# Patient Record
Sex: Male | Born: 1950 | Race: White | Hispanic: No | Smoking: Never smoker
Health system: Southern US, Community
[De-identification: ages and names within clinical notes are randomized; demographics above are authoritative.]

## PROBLEM LIST (undated history)

## (undated) DIAGNOSIS — M1990 Unspecified osteoarthritis, unspecified site: Secondary | ICD-10-CM

## (undated) DIAGNOSIS — C801 Malignant (primary) neoplasm, unspecified: Secondary | ICD-10-CM

## (undated) DIAGNOSIS — E119 Type 2 diabetes mellitus without complications: Secondary | ICD-10-CM

## (undated) DIAGNOSIS — M2550 Pain in unspecified joint: Secondary | ICD-10-CM

## (undated) DIAGNOSIS — I499 Cardiac arrhythmia, unspecified: Secondary | ICD-10-CM

## (undated) DIAGNOSIS — I10 Essential (primary) hypertension: Secondary | ICD-10-CM

## (undated) HISTORY — DX: Type 2 diabetes mellitus without complications: E11.9

---
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
## 1999-01-20 ENCOUNTER — Encounter: Payer: Self-pay | Admitting: Emergency Medicine

## 1999-01-20 ENCOUNTER — Emergency Department (HOSPITAL_COMMUNITY): Admission: EM | Admit: 1999-01-20 | Discharge: 1999-01-20 | Payer: Self-pay | Admitting: Emergency Medicine

## 2004-04-20 ENCOUNTER — Emergency Department (HOSPITAL_COMMUNITY): Admission: EM | Admit: 2004-04-20 | Discharge: 2004-04-20 | Payer: Self-pay | Admitting: *Deleted

## 2004-04-21 ENCOUNTER — Emergency Department (HOSPITAL_COMMUNITY): Admission: EM | Admit: 2004-04-21 | Discharge: 2004-04-22 | Payer: Self-pay | Admitting: Emergency Medicine

## 2005-06-19 ENCOUNTER — Ambulatory Visit (HOSPITAL_BASED_OUTPATIENT_CLINIC_OR_DEPARTMENT_OTHER): Admission: RE | Admit: 2005-06-19 | Discharge: 2005-06-19 | Payer: Self-pay | Admitting: Internal Medicine

## 2005-06-27 ENCOUNTER — Ambulatory Visit: Payer: Self-pay | Admitting: Internal Medicine

## 2005-12-03 ENCOUNTER — Ambulatory Visit: Payer: Self-pay | Admitting: Internal Medicine

## 2007-05-17 ENCOUNTER — Ambulatory Visit: Payer: Self-pay | Admitting: Internal Medicine

## 2007-06-27 ENCOUNTER — Ambulatory Visit: Payer: Self-pay | Admitting: Internal Medicine

## 2007-07-04 ENCOUNTER — Ambulatory Visit: Payer: Self-pay | Admitting: Internal Medicine

## 2007-07-04 ENCOUNTER — Encounter: Payer: Self-pay | Admitting: Internal Medicine

## 2007-07-27 HISTORY — PX: GASTRIC BYPASS: SHX52

## 2007-09-27 DIAGNOSIS — I10 Essential (primary) hypertension: Secondary | ICD-10-CM

## 2007-09-27 DIAGNOSIS — E119 Type 2 diabetes mellitus without complications: Secondary | ICD-10-CM

## 2007-09-27 DIAGNOSIS — K5730 Diverticulosis of large intestine without perforation or abscess without bleeding: Secondary | ICD-10-CM | POA: Insufficient documentation

## 2007-09-27 DIAGNOSIS — D126 Benign neoplasm of colon, unspecified: Secondary | ICD-10-CM

## 2007-09-27 DIAGNOSIS — E669 Obesity, unspecified: Secondary | ICD-10-CM | POA: Insufficient documentation

## 2007-09-27 DIAGNOSIS — G4730 Sleep apnea, unspecified: Secondary | ICD-10-CM | POA: Insufficient documentation

## 2007-09-27 HISTORY — DX: Type 2 diabetes mellitus without complications: E11.9

## 2008-07-26 HISTORY — PX: SALIVARY GLAND SURGERY: SHX768

## 2009-07-04 ENCOUNTER — Ambulatory Visit (HOSPITAL_COMMUNITY): Admission: RE | Admit: 2009-07-04 | Discharge: 2009-07-05 | Payer: Self-pay | Admitting: Otolaryngology

## 2009-07-04 ENCOUNTER — Encounter (INDEPENDENT_AMBULATORY_CARE_PROVIDER_SITE_OTHER): Payer: Self-pay | Admitting: Otolaryngology

## 2010-10-27 LAB — BASIC METABOLIC PANEL
BUN: 7 mg/dL (ref 6–23)
CO2: 29 mEq/L (ref 19–32)
Calcium: 9.1 mg/dL (ref 8.4–10.5)
Chloride: 105 mEq/L (ref 96–112)
Creatinine, Ser: 0.69 mg/dL (ref 0.4–1.5)
GFR calc Af Amer: >60 mL/min (ref >60)
GFR calc non Af Amer: >60 mL/min (ref >60)
Glucose, Bld: 131 mg/dL — ABNORMAL HIGH (ref 70–99)
Potassium: 4.4 mEq/L (ref 3.5–5.1)
Sodium: 139 mEq/L (ref 135–145)

## 2010-10-27 LAB — CBC
HCT: 43.2 % (ref 39.0–52.0)
Hemoglobin: 14.8 g/dL (ref 13.0–17.0)
MCHC: 34.3 g/dL (ref 30.0–36.0)
MCV: 92.9 fL (ref 78.0–100.0)
Platelets: 206 10*3/uL (ref 150–400)
RBC: 4.65 MIL/uL (ref 4.22–5.81)
RDW: 12.4 % (ref 11.5–15.5)
WBC: 6.9 10*3/uL (ref 4.0–10.5)

## 2010-10-27 LAB — GLUCOSE, CAPILLARY: Glucose-Capillary: 151 mg/dL — ABNORMAL HIGH (ref 70–99)

## 2010-12-08 NOTE — Assessment & Plan Note (Signed)
[facility]                         GASTROENTEROLOGY OFFICE NOTE   George Lee, George Lee                     MRN:          [id number]  DATE:05/17/2007                            DOB:          1950/08/05    REFERRING PHYSICIAN:  Geoffry Paradise, M.D.   REASON FOR CONSULTATION:  Screening colonoscopy in an insulin-requiring  diabetic with sleep apnea.   HISTORY:  This is a 60 year old white male with insulin-requiring  diabetes mellitus, morbid obesity, and sleep apnea, for which he wears a  CPAP device.  He also has hypertension.  He is referred through the  courtesy of Dr. Jacky Lee regarding his screening colonoscopy.  The  patient underwent the same evaluation on Dec 03, 2005 in this office.  See that dictation for details.  At that time, the patient states that  this office was to call him back regarding scheduling his colonoscopy,  but this was not completed.  He made no attempts to contact the office  on his own.  In any event, he was encouraged by Dr. Jacky Lee to return to  have the exam completed.  Patient tells me that he has been clinically  stable, although he is interested in proceeding with gastric bypass  surgery to address his obesity and metabolic syndrome.   PAST MEDICAL HISTORY:  1. Hypertension.  2. Insulin-requiring diabetes.  3. Sleep apnea.  4. Obesity.   PAST SURGICAL HISTORY:  Left knee surgery.   ALLERGIES:  Intolerant to CODEINE.   CURRENT MEDICATIONS:  1. Multivitamin.  2. Aspirin 325 mg 2 daily.  3. Triamterene/hydrochlorothiazide 37.5/25 mg daily.  4. Actos 45 mg daily.  5. Metformin 1 gm p.o. b.i.d.  6. Lantus insulin 70 units in the morning and at night.   FAMILY HISTORY:  Negative for gastrointestinal malignancy.   SOCIAL HISTORY:  Patient is divorced with two daughters.  He does not  smoke.  Rarely uses alcohol.   REVIEW OF SYSTEMS:  Negative.   PHYSICAL EXAMINATION:  An obese gentleman in no acute  distress.  Blood pressure is 154/92, heart rate 64, weight is 274.2 pounds.  HEENT:  Sclerae are anicteric.  Conjunctivae are pink.  Oral mucosa  intact.  LUNGS:  Clear.  HEART:  Regular.  ABDOMEN:  Obese and soft without tenderness or mass.  Good bowel sounds  heard.   IMPRESSION:  This is a 60 year old diabetic with sleep apnea who  presents regarding screening colonoscopy.  The nature of the procedure  as well as the risks, benefits and alternatives have been reviewed.  He  understood and agreed to proceed.  With regards to management of his  insulin and oral diabetic agents, we have recommended that we hold both  the morning of his exam.  Also, he has been asked to bring his CPAP  device to the endoscopy suite.  He understands and agrees to proceed.     George Lee. Marina Goodell, MD  Electronically Signed    JNP/MedQ  DD: 05/17/2007  DT: 05/18/2007  Job #: [id number]   cc:   Geoffry Paradise, M.D.

## 2010-12-11 NOTE — Procedures (Signed)
George Lee, George Lee NO.:  [id number]   MEDICAL RECORD NO.:  [id number]          PATIENT TYPE:  OUT   LOCATION:  SLEEP CENTER                 FACILITY:  [facility]   PHYSICIAN:  Clinton D. Maple Hudson, M.D. DATE OF BIRTH:  1951-01-05   DATE OF STUDY:  06/19/2005                              NOCTURNAL POLYSOMNOGRAM   REFERRING PHYSICIAN:  Dr. Geoffry Paradise.   DATE OF STUDY:  June 19, 2005.   INDICATION FOR STUDY:  Hypersomnia with sleep apnea. Epworth sleepiness  score 13/24, BMI 35.7, weight 250 pounds. The patient has a history of  obstructive sleep apnea and has been using CPAP at home. A split study  protocol was requested.   SLEEP ARCHITECTURE:  Total sleep time 315 minutes with sleep efficiency 78%.  Stage I was 31%, stage II 52%, stages III and IV were absent, REM 17% of  total sleep time. Sleep latency 15 minutes, REM latency 259 minutes, awake  after sleep onset 76 minutes, arousal index increased at 45. Sleep was  fragmented initially as the protocol began without CPAP and the patient is  used to wearing CPAP at home. No bedtime medication was taken.   RESPIRATORY DATA:  Split study protocol. Apnea hypopnea index (AHI, RDI)  91.2 obstructive events per hour indicating very severe obstructive sleep  apnea/hypopnea syndrome before CPAP. This included 213 obstructive apneas  and 2 hypopneas before CPAP. Events were not positional with most sleep time  either supine or on right side. REM AHI 2.3 per hour. CPAP was titrated to  14 CWP, 1.2 per hour. A small Respironics ComfortGel nasal mask was used  with a heated humidifier.   OXYGEN DATA:  Moderate snoring with oxygen desaturation to a nadir of 74%  before CPAP. After CPAP control saturation held 95-98% on room air.   CARDIAC DATA:  Normal sinus rhythm.   MOVEMENT/PARASOMNIA:  Occasional leg jerks mostly related to respiratory  events. Bathroom x1.   IMPRESSION/RECOMMENDATION:  1.  Severe obstructive  sleep apnea/hypopnea syndrome, apnea hypopnea index      91.2 per hour with nonpositional events, moderate snoring and oxygen      desaturation to 74%.  2.  Successful continuous positive airway pressure titration to 14 CWP, AHI      1.2 per hour. A small Respironics ComfortGel nasal mask was used with a      heated humidifier.      Clinton D. Maple Hudson, M.D.  Diplomate, Biomedical engineer of Sleep Medicine  Electronically Signed     CDY/MEDQ  D:  06/27/2005 09:26:51  T:  06/27/2005 10:36:37  Job:  [id number]

## 2012-08-21 ENCOUNTER — Ambulatory Visit: Payer: BC Managed Care – PPO | Admitting: Family Medicine

## 2012-08-21 VITALS — BP 146/79 | HR 69 | Temp 98.1°F | Resp 18 | Ht 69.0 in | Wt 196.2 lb

## 2012-08-21 DIAGNOSIS — M720 Palmar fascial fibromatosis [Dupuytren]: Secondary | ICD-10-CM

## 2012-08-21 DIAGNOSIS — Z23 Encounter for immunization: Secondary | ICD-10-CM

## 2012-08-21 DIAGNOSIS — L0591 Pilonidal cyst without abscess: Secondary | ICD-10-CM

## 2012-08-21 NOTE — Progress Notes (Signed)
Patient ID: George Lee. MRN: [id number], DOB: 1950-10-23 62 y.o. Date of Encounter: 08/21/2012, 6:22 PM  Primary Physician: No primary provider on file.  Chief Complaint: Physical (CPE)  HPI: 62 y.o. y/o male with history noted below here for CPE.  Doing well. No issues/complaints except the pilonidal cyst and right dupuytren's contracture right hand  Review of Systems: Consitutional: No fever, chills, fatigue, night sweats, lymphadenopathy, or weight changes. Eyes: No visual changes, eye redness, or discharge. ENT/Mouth: Ears: No otalgia, tinnitus, hearing loss, discharge. Nose: No congestion, rhinorrhea, sinus pain, or epistaxis. Throat: No sore throat, post nasal drip, or teeth pain. Cardiovascular: No CP, palpitations, diaphoresis, DOE, edema, orthopnea, PND. Respiratory: No cough, hemoptysis, SOB, or wheezing. Gastrointestinal: No anorexia, dysphagia, reflux, pain, nausea, vomiting, hematemesis, diarrhea, constipation, BRBPR, or melena. Genitourinary: No dysuria, frequency, urgency, hematuria, incontinence, nocturia, decreased urinary stream, discharge, impotence, or testicular pain/masses. Musculoskeletal: No decreased ROM, myalgias, stiffness, joint swelling, or weakness. Skin: No rash, erythema, lesion changes, pain, warmth, jaundice, or pruritis. Neurological: No headache, dizziness, syncope, seizures, tremors, memory loss, coordination problems, or paresthesias. Psychological: No anxiety, depression, hallucinations, SI/HI. Endocrine: No fatigue, polydipsia, polyphagia, polyuria, or known diabetes. All other systems were reviewed and are otherwise negative.  History reviewed. No pertinent past medical history.   Past Surgical History  Procedure Date  . Gastric bypass 2009    Home Meds:  Prior to Admission medications   Medication Sig Start Date End Date Taking? Authorizing Provider  losartan (COZAAR) 50 MG tablet Take 50 mg by mouth daily.   Yes Historical  Provider, MD    Allergies: No Known Allergies  History   Social History  . Marital Status: Divorced    Spouse Name: N/A    Number of Children: N/A  . Years of Education: N/A   Occupational History  . Not on file.   Social History Main Topics  . Smoking status: Never Smoker   . Smokeless tobacco: Not on file  . Alcohol Use: Not on file  . Drug Use: Not on file  . Sexually Active: Not on file   Other Topics Concern  . Not on file   Social History Narrative  . No narrative on file    Family History  Problem Relation Age of Onset  . Heart disease Mother   . Cancer Father    Father died at 80 Physical Exam: Blood pressure 146/79, pulse 69, temperature 98.1 F (36.7 C), temperature source Oral, resp. rate 18, height 5\' 9"  (1.753 m), weight 196 lb 3.2 oz (88.996 kg), SpO2 97.00%.  General: Well developed, well nourished, in no acute distress. HEENT: Normocephalic, atraumatic. Conjunctiva pink, sclera non-icteric. Pupils 2 mm constricting to 1 mm, round, regular, and equally reactive to light and accomodation. EOMI. Internal auditory canal clear. TMs with good cone of light and without pathology. Nasal mucosa pink. Nares are without discharge. No sinus tenderness. Oral mucosa pink. Dentition good. Pharynx without exudate.   Neck: Supple. Trachea midline. No thyromegaly. Full ROM. No lymphadenopathy. Lungs: Clear to auscultation bilaterally without wheezes, rales, or rhonchi. Breathing is of normal effort and unlabored. Cardiovascular: RRR with S1 S2. No murmurs, rubs, or gallops appreciated. Distal pulses 2+ symmetrically. No carotid or abdominal bruits Abdomen: Soft, non-tender, non-distended with normoactive bowel sounds. No hepatosplenomegaly or masses. No rebound/guarding. No CVA tenderness. Without hernias.  Rectal: No external hemorrhoids or fissures. Rectal vault without masses.  Genitourinary:  circumcised male. No penile lesions. Testes descended bilaterally, and smooth  without tenderness or masses.  Musculoskeletal: Full range of motion and 5/5 strength throughout. Without swelling, atrophy, tenderness, crepitus, or warmth. Extremities without clubbing, cyanosis, or edema. Calves supple. Skin: Warm and moist without erythema, ecchymosis, wounds, or rash. Neuro: A+Ox3. CN II-XII grossly intact. Moves all extremities spontaneously. Full sensation throughout. Normal gait. DTR 2+ throughout upper and lower extremities. Finger to nose intact. Psych:  Responds to questions appropriately with a normal affect.     Assessment/Plan:  62 y.o. y/o  male here for CPE 1. Pilonidal cyst  Ambulatory referral to General Surgery  2. Dupuytren contracture      -  Signed, Elvina Sidle, MD 08/21/2012 6:22 PM     \

## 2012-08-21 NOTE — Patient Instructions (Signed)
Dupuytren's Contracture Dupuytren's contracture affects the fingers and the palm of the hand. This condition usually develops slowly. It may take many years to develop. The pinky finger and the ring finger are most often affected. These fingers start to curve inward, like a claw. At some point, the fingers cannot go straight anymore. This can make it hard to do things like:  Put on gloves.  Shake hands.  Grab something off a shelf. The condition usually does not cause pain and is not dangerous. The condition gets its name from the doctor who came up with an operation to fix the problem. His name was Baron Guillaume Dupuytren. Contracture means pulling inward. CAUSES  Dupuytren's contracture does not start with the fingers. It starts in the palm of the hand, under the skin. The tissue under the skin is called fascia. The fascia covers the cords (tendons) that control how the fingers move. In Dupuytren's contracture the fascia tissue becomes thick and then pulls on the cords. That causes the fingers to curl. The condition can affect both hands and any fingers, but it usually strikes one hand worse than the other. The fingers farthest from the thumb are most often the ones that curl. The cause is not clear. Some experts believe it results from an autoimmune reaction. That means the body's immune system (which fights off disease) attacks itself by mistake. What experts do know is that certain conditions and behaviors (called risk factors) make the chance of having this condition more likely. They include:  Age. Most people who have the condition are older than 50.  Sex. It affects men more often than women.  Family history. The condition tends to run in families from countries in [place].  Certain behaviors. People who smoke and drink alcohol are more apt to develop the problem.  Some other medical conditions. Having diabetes makes Dupuytren's contracture more likely. So does  having a condition that involves a seizure (when the brain's function is interrupted). SYMPTOMS  Signs of this condition take time to develop. Sometimes this takes weeks or months. More often, it takes several years.   Early symptoms:  Skin on the palm of the hand becomes thick. This is usually the first sign.  The skin may look dimpled or puckered.  Lumps (nodules) show up on the palm. There may be one or more lumps. They are not painful.  Later symptoms:  Thick cords of tissue form in the palm of the hand.  The pinky and ring fingers start to curl up into the palm.  The fingers cannot be straightened into their normal position. DIAGNOSIS  A physical examination is the main way that a healthcare provider can tell if you have Dupuytren's contracture. Other tests usually are not needed. The caregiver will probably:  Look at your hands. Feel your hands. This is to check for thickening and nodules.  Measure finger motion. This tells how much your fingers have contracted (pulled in).  Do a tabletop test. You will be asked to try to put your hand flat on a table, palm down. TREATMENT  There is no cure for Dupuytren's contracture. But there are ways to treat the symptoms. Options include:  Watching and waiting. The condition develops slowly. Often it does not create problems for a long time. Sometimes the skin gets thick and nodules form, but the fingers never curl. So, in some cases it is best to just watch the condition carefully and wait to see what happens.    Shots (injections). Different substances may be injected, including:  Steroids. These drugs block swelling. These shots should make the condition less uncomfortable. Steroids may also slow down the condition. Shots are given into the nodules. The effect only lasts awhile. More shots may have to be given.  Enzymes. These are proteins. They weaken the thick tissue. After an injection, the caregiver usually stretches the  fingers.  Needling. A needle is pushed through the skin and into the thick tissue. This is done in several spots. The goal is to break up the thickened tissue. Or to weaken it.  Surgery. This may be suggested if you cannot grasp objects. Or, if you can no longer put your hand in your pocket.  A cut (incision) is made in the palm of the hand. The thick tissue is removed.  Sometimes the thick tissue is attached to the skin. Then, the skin must be removed, too. It is replaced with a piece of skin from another place on your body. That is called a skin graft.  Occupational or hand therapy is almost always needed after surgery. This involves special exercises to get back the use of your hand and fingers. After a skin graft, several months of therapy may be needed.  Sometimes the condition comes back, even after surgery.  Other methods. You can do some things on your own. They include:  Stretching the fingers backwards. Do this often.  Warming the hand and massaging it. Again, do this often.  Using tools with padded grips. This should make things easier.  Wearing heavy gloves while working. This protects the hands. PROGNOSIS  Dupuytren's contracture usually develops slowly. There is no cure. But, the symptoms can be treated. Sometimes they come back after treatment, but not always. It is important to remember that this is a functional problem and not a life-threatening condition. Document Released: 05/09/2009 Document Revised: 10/04/2011 Document Reviewed: 05/09/2009 [facility] Patient Information 2013 [place], [place]. Pilonidal Cyst A pilonidal cyst occurs when hairs get trapped (ingrown) beneath the skin in the crease between the buttocks over your sacrum (the bone under that crease). Pilonidal cysts are most common in young men with a lot of body hair. When the cyst is ruptured (breaks) or leaking, fluid from the cyst may cause burning and itching. If the cyst becomes infected, it causes a  painful swelling filled with pus (abscess). The pus and trapped hairs need to be removed (often by lancing) so that the infection can heal. However, recurrence is common and an operation may be needed to remove the cyst. HOME CARE INSTRUCTIONS   If the cyst was NOT INFECTED:  Keep the area clean and dry. Bathe or shower daily. Wash the area well with a germ-killing soap. Warm tub baths may help prevent infection and help with drainage. Dry the area well with a towel.  Avoid tight clothing to keep area as moisture free as possible.  Keep area between buttocks as free of hair as possible. A depilatory may be used.  If the cyst WAS INFECTED and needed to be drained:  Your caregiver packed the wound with gauze to keep the wound open. This allows the wound to heal from the inside outwards and continue draining.  Return for a wound check in 1 day or as suggested.  If you take tub baths or showers, repack the wound with gauze following them. Sponge baths (at the sink) are a good alternative.  If an antibiotic was ordered to fight the infection, take as directed.  Only take over-the-counter or prescription medicines for pain, discomfort, or fever as directed by your caregiver.  After the drain is removed, use sitz baths for 20 minutes 4 times per day. Clean the wound gently with mild unscented soap, pat dry, and then apply a dry dressing. SEEK MEDICAL CARE IF:   You have increased pain, swelling, redness, drainage, or bleeding from the area.  You have a fever.  You have muscles aches, dizziness, or a general ill feeling. Document Released: 07/09/2000 Document Revised: 10/04/2011 Document Reviewed: 09/06/2008 [facility] Patient Information 2013 [place], [place].

## 2012-08-24 ENCOUNTER — Encounter: Payer: Self-pay | Admitting: Family Medicine

## 2012-09-04 ENCOUNTER — Ambulatory Visit (INDEPENDENT_AMBULATORY_CARE_PROVIDER_SITE_OTHER): Payer: BC Managed Care – PPO | Admitting: Surgery

## 2012-09-11 ENCOUNTER — Encounter (INDEPENDENT_AMBULATORY_CARE_PROVIDER_SITE_OTHER): Payer: Self-pay

## 2012-09-11 ENCOUNTER — Ambulatory Visit (INDEPENDENT_AMBULATORY_CARE_PROVIDER_SITE_OTHER): Payer: BC Managed Care – PPO | Admitting: Surgery

## 2012-09-11 ENCOUNTER — Encounter (INDEPENDENT_AMBULATORY_CARE_PROVIDER_SITE_OTHER): Payer: Self-pay | Admitting: Surgery

## 2012-09-11 VITALS — BP 134/82 | HR 80 | Temp 97.4°F | Resp 18 | Ht 69.0 in | Wt 195.0 lb

## 2012-09-11 DIAGNOSIS — M533 Sacrococcygeal disorders, not elsewhere classified: Secondary | ICD-10-CM | POA: Insufficient documentation

## 2012-09-11 NOTE — Progress Notes (Signed)
Subjective:     Patient ID: George Lee., male   DOB: 05-31-1951, 62 y.o.   MRN: [id number]  HPI  George Lee  1950-11-10 [id number]  Patient Care Team: Minda Meo, MD as PCP - General (Internal Medicine) Elvina Sidle, MD as Referring Physician (Family Medicine)  This patient is a 62 y.o.male who presents today for surgical evaluation at the request of Dr. Danella Deis.   Reason for visit: Concern of pilonidal disease.  Pleasant male.  Underwent laparoscopic gastric bypass in [facility] and 2009.  Lost weight.  Diabetes cured.  More active now.  He notes since he has lost all that weight his backside is much more sensitive.  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  Recalls evidence of infections down there when he was a teenager.  Had drainage then.  Never had to have any incision and drainage done.  Does use heater compresses.  No problems for years.  The main issues and pain had an episode of skin infection on her right calf a few years ago that required incision and drainage.  That did not seem to be associated with his tailbone pain.  Normally has bowel movement twice a day.  Tolerates a rather regular diet after his gastric bypass.  Rather active.  Based on concerns, he discussed it with his primary care physician team.  Dr. Danella Deis sent the patient to me for evaluation.  Patient Active Problem List  Diagnosis  . COLONIC POLYPS  . DIABETES MELLITUS  . OBESITY  . HYPERTENSION  . DIVERTICULOSIS, SIGMOID COLON  . SLEEP APNEA    No past medical history on file.  Past Surgical History  Procedure Laterality Date  . Gastric bypass  2009  . Salivary gland surgery  2010    stone    History   Social History  . Marital Status: Divorced    Spouse Name: N/A    Number of Children: N/A  . Years of Education: N/A   Occupational History  . Not on file.   Social History Main Topics  . Smoking status: Never Smoker   .  Smokeless tobacco: Not on file  . Alcohol Use: Not on file  . Drug Use: Not on file  . Sexually Active: Not on file   Other Topics Concern  . Not on file   Social History Narrative  . No narrative on file    Family History  Problem Relation Age of Onset  . Heart disease Mother   . Cancer Father     Current Outpatient Prescriptions  Medication Sig Dispense Refill  . Calcium Carbonate-Vitamin D (CALCIUM-VITAMIN D) 500-200 MG-UNIT per tablet Take 1 tablet by mouth 2 (two) times daily with a meal.      . calcium-vitamin D (OSCAL WITH D) 500-200 MG-UNIT per tablet Take 1 tablet by mouth 2 (two) times daily.      . ferrous fumarate (HEMOCYTE - 106 MG FE) 325 (106 FE) MG TABS Take 1 tablet by mouth.      . losartan (COZAAR) 50 MG tablet Take 50 mg by mouth daily.      . magnesium 30 MG tablet Take 30 mg by mouth 2 (two) times daily.      . Multiple Vitamin (MULTIVITAMIN) tablet Take 1 tablet by mouth daily.       No current facility-administered medications for this visit.     No Known Allergies  BP 134/82  Pulse  80  Temp(Src) 97.4 F (36.3 C)  Resp 18  Ht 5\' 9"  (1.753 m)  Wt 195 lb (88.451 kg)  BMI 28.78 kg/m2  No results found.    Review of Systems  Constitutional: Negative for fever, chills and diaphoresis.  HENT: Negative for nosebleeds, sore throat, facial swelling, mouth sores, trouble swallowing and ear discharge.   Eyes: Negative for photophobia, discharge and visual disturbance.  Respiratory: Negative for choking, chest tightness, shortness of breath and stridor.   Cardiovascular: Negative for chest pain and palpitations.  Gastrointestinal: Negative for nausea, vomiting, abdominal pain, diarrhea, constipation, blood in stool, abdominal distention, anal bleeding and rectal pain.  Genitourinary: Negative for dysuria, urgency, difficulty urinating and testicular pain.  Musculoskeletal: Negative for myalgias, back pain, arthralgias and gait problem.  Skin: Negative  for color change, pallor, rash and wound.  Neurological: Negative for dizziness, speech difficulty, weakness, numbness and headaches.  Hematological: Negative for adenopathy. Does not bruise/bleed easily.  Psychiatric/Behavioral: Negative for hallucinations, confusion and agitation.       Objective:   Physical Exam  Constitutional: He is oriented to person, place, and time. He appears well-developed and well-nourished. No distress.  HENT:  Head: Normocephalic.  Mouth/Throat: Oropharynx is clear and moist. No oropharyngeal exudate.  Eyes: Conjunctivae and EOM are normal. Pupils are equal, round, and reactive to light. No scleral icterus.  Neck: Normal range of motion. Neck supple. No tracheal deviation present.  Cardiovascular: Normal rate, regular rhythm and intact distal pulses.   Pulmonary/Chest: Effort normal and breath sounds normal. No respiratory distress.  Abdominal: Soft. He exhibits no distension. There is no tenderness. Hernia confirmed negative in the right inguinal area and confirmed negative in the left inguinal area.  Genitourinary:  Perianal skin clean with good hygiene.  No pruritis.  No external skin tags / hemorrhoids of significance.  No pilonidal disease.  No fissure.  No abscess/fistula.  Normal sphincter tone.      Musculoskeletal: Normal range of motion. He exhibits no tenderness.       Back:  Lymphadenopathy:    He has no cervical adenopathy.       Right: No inguinal adenopathy present.       Left: No inguinal adenopathy present.  Neurological: He is alert and oriented to person, place, and time. No cranial nerve deficit. He exhibits normal muscle tone. Coordination normal.  Skin: Skin is warm and dry. No rash noted. He is not diaphoretic. No erythema. No pallor.  Psychiatric: He has a normal mood and affect. His behavior is normal. Judgment and thought content normal.       Assessment:     Coccydynia.  No evidence of pilonidal disease     Plan:     I  see no evidence of pilonidal disease or infection.  I recommended an anti-inflammatory regimen for the next 3-4 weeks.  Given his history of gastric bypass, and NSIADse not a good long-term option.  Therefore Tylenol 1 gram 3-4 times a day.  Also heat.  Continue relieving pressure off his tailbone using the U-shaped cushions that he uses.  He works well while driving the truck.  He should continue to use it at home as well.  Break the cycle of re-triggering the pain.  If he has not getting better, consider evaluation by a spine surgeon to see what other options are available.  I would hesitate to do steroid injections or removing of the coccyx at this time until other options have been exhausted.  I  was able to reach Dr. Benjie Karvonen with spine surgery.  He thought the above recommendations were reasonable.  If not better by six weeks, consider plain films and followup with Dr. Simonne Come to see what other options are available.  Usually anti-inflammatories and pressure relief resolve most of these issues in the absence of other symptoms.

## 2012-09-11 NOTE — Patient Instructions (Addendum)
Managing Pain  Pain after surgery or related to activity is often due to strain/injury to muscle, tendon, nerves and/or incisions.  This pain is usually short-term and will improve in a few months.   Many people find it helpful to do the following things TOGETHER to help speed the process of healing and to get back to regular activity more quickly:  1. Avoid heavy physical activity a.  no lifting greater than 20 pounds b. Do not "push through" the pain.  Listen to your body and avoid positions and maneuvers than reproduce the pain c. Walking is okay as tolerated, but go slowly and stop when getting sore.  d. Remember: If it hurts to do it, then don't do it! 2. Take Anti-inflammatory medication  a. Take with food/snack around the clock for 3-4 weeks i. This helps the muscle and nerve tissues become less irritable and calm down faster ii. Choose Acetaminophen 500mg  tabs (Tylenol) 2 pills with every meal and just before bedtime 3. Use a Heating pad or Ice/Cold Pack a. 4-6 times a day b. May use warm bath/hottub  or showers 4. Try Gentle Massage and/or Stretching  a. at the area of pain many times a day b. stop if you feel pain - do not overdo it  Try these steps together to help you body heal faster and avoid making things get worse.  Doing just one of these things may not be enough.    If you are not getting better after two weeks or are noticing you are getting worse, contact our office for further advice; we may need to re-evaluate you & see what other things we can do to help.    Tailbone Injury The tailbone (coccyx) is the small bone at the lower end of the spine. A tailbone injury may involve stretched ligaments, bruising, or a broken bone (fracture). Women are more vulnerable to this injury due to having a wider pelvis. CAUSES  This type of injury typically occurs from falling and landing on the tailbone. Repeated strain or friction from actions such as rowing and bicycling may  also injure the area. The tailbone can be injured during childbirth. Infections or tumors may also press on the tailbone and cause pain. Sometimes, the cause of injury is unknown. SYMPTOMS   Bruising.  Pain when sitting.  Painful bowel movements.  In women, pain during intercourse. DIAGNOSIS  Your caregiver can diagnose a tailbone injury based on your symptoms and a physical exam. X-rays may be taken if a fracture is suspected. Your caregiver may also use an MRI scan imaging test to evaluate your symptoms. TREATMENT  Your caregiver may prescribe medicines to help relieve your pain. Most tailbone injuries heal on their own in 4 to 6 weeks. However, if the injury is caused by an infection or tumor, the recovery period may vary. PREVENTION  Wear appropriate padding and sports gear when bicycling and rowing. This can help prevent an injury from repeated strain or friction. HOME CARE INSTRUCTIONS   Put ice on the injured area.  Put ice in a plastic bag.  Place a towel between your skin and the bag.  Leave the ice on for 15 to 20 minutes, every hour while awake for the first 1 to 2 days.  Sit on a large, rubber or inflated ring or cushion to ease your pain. Lean forward when sitting to help decrease discomfort.  Avoid sitting for long periods of time.  Increase your activity as the pain allows.  Only take over-the-counter or prescription medicines for pain, discomfort, or fever as directed by your caregiver.  You may use stool softeners if it is painful to have a bowel movement, or as directed by your caregiver.  Eat a diet with plenty of fiber to help prevent constipation.  Keep all follow-up appointments as directed by your caregiver. SEEK MEDICAL CARE IF:   Your pain becomes worse.  Your bowel movements cause a great deal of discomfort.  You are unable to have a bowel movement.  You have a fever. MAKE SURE YOU:  Understand these instructions.  Will watch your  condition.  Will get help right away if you are not doing well or get worse. Document Released: 07/09/2000 Document Revised: 10/04/2011 Document Reviewed: 02/04/2011 [facility] Patient Information 2013 [place], [place].   George Lee still sensitive at the coccyx.  Think this is a chronic coccyx pain.  I strongly recommend he do an anti-inflammatory regimen for at least three weeks.  Given his history gastric bypass, would be safest to do Tylenol 1 g 3 times a day.

## 2014-05-03 ENCOUNTER — Ambulatory Visit (INDEPENDENT_AMBULATORY_CARE_PROVIDER_SITE_OTHER): Payer: Self-pay | Admitting: Family Medicine

## 2014-05-03 VITALS — BP 132/76 | HR 93 | Temp 98.1°F | Resp 16 | Ht 69.0 in | Wt 188.4 lb

## 2014-05-03 DIAGNOSIS — Z131 Encounter for screening for diabetes mellitus: Secondary | ICD-10-CM

## 2014-05-03 DIAGNOSIS — E119 Type 2 diabetes mellitus without complications: Secondary | ICD-10-CM

## 2014-05-03 LAB — POCT GLYCOSYLATED HEMOGLOBIN (HGB A1C): Hemoglobin A1C: 7.8

## 2014-05-03 MED ORDER — METFORMIN HCL 500 MG PO TABS: 500.0000 mg | ORAL_TABLET | Freq: Every day | ORAL | Status: DC

## 2014-05-03 NOTE — Progress Notes (Signed)
Chief Complaint:  Chief Complaint  Patient presents with  . Diabetes    Needs proof of treatment and labs...to extend DOT from 3 months to 1 year with another provider    HPI: George Lee. is a 63 y.o. male who is here for  DM followup He has HTN and has DM  In the past, he has been out of work for 1 year. 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  He needsa 1 year DOT to get this new job he is applying for. He was morbidly onese and had HTN and DM prior to gettign a gastric byplass ( [place]) and after the operation he was not longer hypertensive or diabetic with weight loss and lifestyle modifications. Prior to today's DOT PE he was a diet controlled diabetic. Since he has been unemployed he has been eating poorly and not exercising, he ahs gained some weight back. He states when he is working he eats really well.   Prior to the surgery he was 360 lbs, today he is 188 lbs.   Past Medical History  Diagnosis Date  . DIABETES MELLITUS 09/27/2007    Qualifier: Diagnosis of  By: Harlon Ditty CMA (AAMA), Dottie     Past Surgical History  Procedure Laterality Date  . Gastric bypass  2009  . Salivary gland surgery  2010    stone   History   Social History  . Marital Status: Divorced    Spouse Name: N/A    Number of Children: N/A  . Years of Education: N/A   Social History Main Topics  . Smoking status: Never Smoker   . Smokeless tobacco: None  . Alcohol Use: None  . Drug Use: None  . Sexual Activity: None   Other Topics Concern  . None   Social History Narrative  . None   Family History  Problem Relation Age of Onset  . Heart disease Mother   . Cancer Father    No Known Allergies Prior to Admission medications   Medication Sig Start Date End Date Taking? Authorizing Provider  Calcium Carbonate-Vitamin D (CALCIUM-VITAMIN D) 500-200 MG-UNIT per tablet Take 1 tablet by  mouth 2 (two) times daily with a meal.   Yes Historical Provider, MD  calcium-vitamin D (OSCAL WITH D) 500-200 MG-UNIT per tablet Take 1 tablet by mouth 2 (two) times daily.   Yes Historical Provider, MD  ferrous fumarate (HEMOCYTE - 106 MG FE) 325 (106 FE) MG TABS Take 1 tablet by mouth.   Yes Historical Provider, MD  magnesium 30 MG tablet Take 30 mg by mouth 2 (two) times daily.   Yes Historical Provider, MD  Multiple Vitamin (MULTIVITAMIN) tablet Take 1 tablet by mouth daily.   Yes Historical Provider, MD  losartan (COZAAR) 50 MG tablet Take 50 mg by mouth daily.    Historical Provider, MD     ROS: The patient denies fevers, chills, night sweats, unintentional weight loss, chest pain, palpitations, wheezing, dyspnea on exertion, nausea, vomiting, abdominal pain, dysuria, hematuria, melena, numbness, weakness, or tingling.   All other systems have been reviewed and were otherwise negative with the exception of those mentioned in the HPI and as above.    PHYSICAL EXAM: Filed Vitals:   05/03/14 1438  BP: 132/76  Pulse: 93  Temp: 98.1 F (36.7 C)  Resp: 16  Filed Vitals:   05/03/14 1438  Height: 5\' 9"  (1.753 m)  Weight: 188 lb 6.4 oz (85.458 kg)   Body mass index is 27.81 kg/(m^2).  General: Alert, no acute distress HEENT:  Normocephalic, atraumatic, oropharynx patent. EOMI, PERRLA Cardiovascular:  Regular rate and rhythm, no rubs murmurs or gallops.  No Carotid bruits, radial pulse intact. No pedal edema.  Respiratory: Clear to auscultation bilaterally.  No wheezes, rales, or rhonchi.  No cyanosis, no use of accessory musculature GI: No organomegaly, abdomen is soft and non-tender, positive bowel sounds.  No masses. Skin: No rashes. Neurologic: Facial musculature symmetric. Microfilament exam nl Psychiatric: Patient is appropriate throughout our interaction. Lymphatic: No cervical lymphadenopathy Musculoskeletal: Gait intact.   LABS: Results for orders placed in visit on  05/03/14  POCT GLYCOSYLATED HEMOGLOBIN (HGB A1C)      Result Value Ref Range   Hemoglobin A1C 7.8       EKG/XRAY:   Primary read interpreted by Dr. Marin Comment at [facility].   ASSESSMENT/PLAN: Encounter Diagnoses  Name Primary?  . Screening for diabetes mellitus Yes  . Type 2 diabetes mellitus without complication    Will start on metformin 500 mg daily, since he will also start lifestyle modification, if sugar still high in 1 month will try 500 mg BID He will return in 1 month to get his kidney function rechecked.  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  F/u in 1 month    Gross sideeffects, risk and benefits, and alternatives of medications d/w patient. Patient is aware that all medications have potential sideeffects and we are unable to predict every sideeffect or drug-drug interaction that may occur.  Enora Trillo, [place], DO 05/06/2014 6:38 AM

## 2014-05-23 ENCOUNTER — Telehealth: Payer: Self-pay | Admitting: *Deleted

## 2014-05-23 NOTE — Telephone Encounter (Signed)
Patient called today need his Release of work form signed by Dr. Marin Comment.   He went to a doctor at [facility] off of [address] for his DOT and then they sent him to another doctor.  [facility] faxed [place] a form today to have signed.    Dr. Marin Comment check his A1C.    Pt # [phone] - Please call once form has been signed or form has not been sent to our office for signature.   DOS 05/03/2014 -  Chief Complaint   Patient presents with     Diabetes     Needs proof of treatment and labs...to extend DOT from 3 months to 1 year with another provider   HPI:  George Lee. is a 63 y.o. male who is here for DM followup  He has HTN and has DM In the past, he has been out of work for 1 year. 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  He needsa 1 year DOT to get this new job he is applying for.

## 2014-05-24 ENCOUNTER — Ambulatory Visit (INDEPENDENT_AMBULATORY_CARE_PROVIDER_SITE_OTHER): Payer: Self-pay | Admitting: Family Medicine

## 2014-05-24 ENCOUNTER — Telehealth: Payer: Self-pay | Admitting: *Deleted

## 2014-05-24 VITALS — BP 136/64 | HR 70 | Temp 98.1°F | Resp 16 | Ht 69.0 in | Wt 198.0 lb

## 2014-05-24 DIAGNOSIS — E118 Type 2 diabetes mellitus with unspecified complications: Secondary | ICD-10-CM

## 2014-05-24 LAB — POCT GLYCOSYLATED HEMOGLOBIN (HGB A1C): HEMOGLOBIN A1C: 8.1

## 2014-05-24 MED ORDER — METFORMIN HCL 500 MG PO TABS: 500.0000 mg | ORAL_TABLET | Freq: Every day | ORAL | Status: DC

## 2014-05-24 MED ORDER — METFORMIN HCL 500 MG PO TABS: 500.0000 mg | ORAL_TABLET | Freq: Two times a day (BID) | ORAL | Status: DC

## 2014-05-24 NOTE — Telephone Encounter (Signed)
Faxed Release of Work documents to Dr. Flora Lipps, per Dr Marin Comment. I called patient to advise documents were faxed.

## 2014-05-24 NOTE — Telephone Encounter (Signed)
Faxed Release of Work documents to Dr Flora Lipps, per Dr Marin Comment. Confirmation page received at 4:04 pm.

## 2014-05-24 NOTE — Telephone Encounter (Signed)
Pt was seen in office today. This was addressed during the OV

## 2014-05-24 NOTE — Patient Instructions (Signed)
Start taking metformin twice a day and please come and see [place] in about 2 months for a diabetes check.  Take care and continue to work on you diet and exercise for better glucose control.

## 2014-05-24 NOTE — Progress Notes (Signed)
[facility] [address], [place] [address] [phone]  Date:  05/24/2014   Name:  George Lee.   DOB:  [birth year]   MRN:  [id number]  PCP:  Geoffery Lyons, MD    Chief Complaint: A1C check and Medication Refill   History of Present Illness:  George E Jaheem Hedgepath. is a 63 y.o. very pleasant male patient who presents with the following:  Here today to recheck DM.  He was here earlier this month and had the A1c below.  He has just started back on his metformin when he was here earlier this month.  He had DM in the past but got in under control following his gastric bypass.  However over the last few years he has slipped on his diet and exercise program. According to his company he needs another A1c today- I did explain to him that it is really too soon to test again He also needs an rx for a meter.   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    Lab Results  Component Value Date   HGBA1C 7.8 05/03/2014   Wt Readings from Last 3 Encounters:  05/24/14 198 lb (89.812 kg)  05/03/14 188 lb 6.4 oz (85.458 kg)  09/11/12 195 lb (88.451 kg)      Patient Active Problem List   Diagnosis Date Noted  . Coccyxdynia 09/11/2012  . COLONIC POLYPS 09/27/2007  . DIVERTICULOSIS, SIGMOID COLON 09/27/2007  . SLEEP APNEA 09/27/2007    Past Medical History  Diagnosis Date  . DIABETES MELLITUS 09/27/2007    Qualifier: Diagnosis of  By: Harlon Ditty CMA (AAMA), Dottie      Past Surgical History  Procedure Laterality Date  . Gastric bypass  2009  . Salivary gland surgery  2010    stone    History  Substance Use Topics  . Smoking status: Never Smoker   . Smokeless tobacco: Not on file  . Alcohol Use: Not on file    Family History  Problem Relation Age of Onset  . Heart disease Mother   . Cancer Father     No Known Allergies  Medication list has been reviewed and  updated.  Current Outpatient Prescriptions on File Prior to Visit  Medication Sig Dispense Refill  . Calcium Carbonate-Vitamin D (CALCIUM-VITAMIN D) 500-200 MG-UNIT per tablet Take 1 tablet by mouth 2 (two) times daily with a meal.      . calcium-vitamin D (OSCAL WITH D) 500-200 MG-UNIT per tablet Take 1 tablet by mouth 2 (two) times daily.      Marland Kitchen losartan (COZAAR) 50 MG tablet Take 50 mg by mouth daily.      . magnesium 30 MG tablet Take 30 mg by mouth 2 (two) times daily.      . metFORMIN (GLUCOPHAGE) 500 MG tablet Take 1 tablet (500 mg total) by mouth daily with breakfast.  30 tablet  0  . Multiple Vitamin (MULTIVITAMIN) tablet Take 1 tablet by mouth daily.      . ferrous fumarate (HEMOCYTE - 106 MG FE) 325 (106 FE) MG TABS Take 1 tablet by mouth.       No current facility-administered medications on file prior to visit.    Review of Systems:  As per HPI- otherwise negative.  Physical Examination: Filed Vitals:   05/24/14 1105  BP: 136/64  Pulse: 70  Temp: 98.1 F (36.7  C)  Resp: 16   Filed Vitals:   05/24/14 1105  Height: 5\' 9"  (1.753 m)  Weight: 198 lb (89.812 kg)   Body mass index is 29.23 kg/(m^2). Ideal Body Weight: Weight in (lb) to have BMI = 25: 168.9  GEN: WDWN, NAD, Non-toxic, A & O x 3, overweight, looks well HEENT: Atraumatic, Normocephalic. Neck supple. No masses, No LAD. Ears and Nose: No external deformity. CV: RRR, No M/G/R. No JVD.  PULM: CTA B, no wheezes, crackles, rhonchi. No retractions. No resp. distress. No accessory muscle use. EXTR: No c/c/e NEURO Normal gait.  PSYCH: Normally interactive. Conversant. Not depressed or anxious appearing.  Calm demeanor.    Results for orders placed in visit on 05/24/14  POCT GLYCOSYLATED HEMOGLOBIN (HGB A1C)      Result Value Ref Range   Hemoglobin A1C 8.1      Assessment and Plan: Diabetes mellitus type 2 with complications - Plan: POCT glycosylated hemoglobin (Hb A1C), metFORMIN (GLUCOPHAGE) 500 MG  tablet, DISCONTINUED: metFORMIN (GLUCOPHAGE) 500 MG tablet  Counseled him that his A1c is virtually unchanged and it is too early to see any definite difference. He would like to go to metformin BID which is reasonable.  He will work on his diet and exercise and use the metfomin BID.  He will come and see [place] for a follow-up in 2 months.    Signed Lamar Blinks, MD

## 2014-05-27 ENCOUNTER — Telehealth: Payer: Self-pay | Admitting: *Deleted

## 2014-05-27 NOTE — Telephone Encounter (Signed)
Spoke to patient to advise him the records were faxed to Dr Flora Lipps, per Dr Marin Comment. Confirmation page received at 4:00 pm.

## 2014-09-05 NOTE — Telephone Encounter (Signed)
error 

## 2014-09-05 NOTE — Telephone Encounter (Deleted)
error 

## 2014-09-16 LAB — HM DIABETES EYE EXAM

## 2015-01-07 ENCOUNTER — Other Ambulatory Visit: Payer: Self-pay | Admitting: Family Medicine

## 2015-02-04 ENCOUNTER — Other Ambulatory Visit: Payer: Self-pay | Admitting: Family Medicine

## 2015-03-19 ENCOUNTER — Other Ambulatory Visit: Payer: Self-pay | Admitting: Urgent Care

## 2015-04-13 ENCOUNTER — Other Ambulatory Visit: Payer: Self-pay | Admitting: Urgent Care

## 2015-04-14 NOTE — Telephone Encounter (Signed)
Talked w/pt and advised he is very overdue for f/up. He agreed to come in tomorrow to be seen and reported he has enough medicine to last until after OV.

## 2015-04-16 ENCOUNTER — Telehealth: Payer: Self-pay | Admitting: Urgent Care

## 2015-04-16 MED ORDER — METFORMIN HCL 500 MG PO TABS: 500.0000 mg | ORAL_TABLET | Freq: Two times a day (BID) | ORAL | Status: DC

## 2015-04-16 NOTE — Telephone Encounter (Signed)
Patient called to check the status of his medication request. Gave him the following message from [facility]":  Dallas Schimke, RN at 04/14/2015 3:42 PM     Status: Signed       Expand All Collapse All   Talked w/pt and advised he is very overdue for f/up. He agreed to come in tomorrow to be seen and reported he has enough medicine to last until after OV.       Patient states he does not remember this however he wants to know if he can go ahead and have a refill and he plans to come in Sunday.   [phone]

## 2015-04-16 NOTE — Telephone Encounter (Signed)
Can we refill? 

## 2015-04-16 NOTE — Telephone Encounter (Signed)
The patient has been notified multiple times that he needs a follow-up visit.  He did not come in yesterday, as he indicated he would in the message two days ago.  What is the revised plan?  Meds ordered this encounter  Medications  . metFORMIN (GLUCOPHAGE) 500 MG tablet    Sig: Take 1 tablet (500 mg total) by mouth 2 (two) times daily with a meal.    Dispense:  30 tablet    Refill:  0    Please remind this patient that he is overdue for re-evaluation in our office, including fasting labs. He has been notified of this several times already.    Order Specific Question:  Supervising Provider    Answer:  Tami Lin P [[id number]]

## 2015-04-16 NOTE — Telephone Encounter (Signed)
This was sent to referrals, but I believe it was meant for clinical.

## 2015-04-20 ENCOUNTER — Ambulatory Visit (INDEPENDENT_AMBULATORY_CARE_PROVIDER_SITE_OTHER): Payer: BLUE CROSS/BLUE SHIELD | Admitting: Family Medicine

## 2015-04-20 VITALS — BP 126/80 | HR 70 | Temp 98.2°F | Resp 17 | Ht 68.5 in | Wt 188.0 lb

## 2015-04-20 DIAGNOSIS — Z79899 Other long term (current) drug therapy: Secondary | ICD-10-CM | POA: Diagnosis not present

## 2015-04-20 DIAGNOSIS — E1165 Type 2 diabetes mellitus with hyperglycemia: Secondary | ICD-10-CM | POA: Diagnosis not present

## 2015-04-20 DIAGNOSIS — Z23 Encounter for immunization: Secondary | ICD-10-CM

## 2015-04-20 LAB — COMPREHENSIVE METABOLIC PANEL
ALT: 30 U/L (ref 9–46)
AST: 29 U/L (ref 10–35)
Albumin: 4.6 g/dL (ref 3.6–5.1)
Alkaline Phosphatase: 54 U/L (ref 40–115)
BUN: 15 mg/dL (ref 7–25)
CALCIUM: 9.7 mg/dL (ref 8.6–10.3)
CHLORIDE: 100 mmol/L (ref 98–110)
CO2: 28 mmol/L (ref 20–31)
Creat: 0.8 mg/dL (ref 0.70–1.25)
Glucose, Bld: 159 mg/dL — ABNORMAL HIGH (ref 65–99)
Potassium: 4.6 mmol/L (ref 3.5–5.3)
SODIUM: 136 mmol/L (ref 135–146)
Total Bilirubin: 0.4 mg/dL (ref 0.2–1.2)
Total Protein: 6.9 g/dL (ref 6.1–8.1)

## 2015-04-20 LAB — POCT GLYCOSYLATED HEMOGLOBIN (HGB A1C): Hemoglobin A1C: 8.1

## 2015-04-20 MED ORDER — METFORMIN HCL 1000 MG PO TABS: 1000.0000 mg | ORAL_TABLET | Freq: Two times a day (BID) | ORAL | Status: DC

## 2015-04-20 MED ORDER — LOSARTAN POTASSIUM 50 MG PO TABS: 50.0000 mg | ORAL_TABLET | Freq: Every day | ORAL | Status: DC

## 2015-04-20 NOTE — Patient Instructions (Signed)
You are OVERDUE for fasting labs - we would like to check your cholesterol.  If you would like to make an appointment for fasting labs at your full physical you are welcome to do that or you can come in to our walk-in clinic some Sunday morning for this - nothing to eat or drink for more than 8 hours before but DO take your medicines that day.  Diabetes and Standards of Medical Care Diabetes is complicated. You may find that your diabetes team includes a dietitian, nurse, diabetes educator, eye doctor, and more. To help everyone know what is going on and to help you get the care you deserve, the following schedule of care was developed to help keep you on track. Below are the tests, exams, vaccines, medicines, education, and plans you will need. HbA1c test This test shows how well you have controlled your glucose over the past 2-3 months. It is used to see if your diabetes management plan needs to be adjusted.   It is performed at least 2 times a year if you are meeting treatment goals.  It is performed 4 times a year if therapy has changed or if you are not meeting treatment goals. Blood pressure test  This test is performed at every routine medical visit. The goal is less than 140/90 mm Hg for most people, but 130/80 mm Hg in some cases. Ask your health care provider about your goal. Dental exam  Follow up with the dentist regularly. Eye exam  If you are diagnosed with type 1 diabetes as a child, get an exam upon reaching the age of 86 years or older and have had diabetes for 3-5 years. Yearly eye exams are recommended after that initial eye exam.  If you are diagnosed with type 1 diabetes as an adult, get an exam within 5 years of diagnosis and then yearly.  If you are diagnosed with type 2 diabetes, get an exam as soon as possible after the diagnosis and then yearly. Foot care exam  Visual foot exams are performed at every routine medical visit. The exams check for cuts, injuries, or  other problems with the feet.  A comprehensive foot exam should be done yearly. This includes visual inspection as well as assessing foot pulses and testing for loss of sensation.  Check your feet nightly for cuts, injuries, or other problems with your feet. Tell your health care provider if anything is not healing. Kidney function test (urine microalbumin)  This test is performed once a year.  Type 1 diabetes: The first test is performed 5 years after diagnosis.  Type 2 diabetes: The first test is performed at the time of diagnosis.  A serum creatinine and estimated glomerular filtration rate (eGFR) test is done once a year to assess the level of chronic kidney disease (CKD), if present. Lipid profile (cholesterol, HDL, LDL, triglycerides)  Performed every 5 years for most people.  The goal for LDL is less than 100 mg/dL. If you are at high risk, the goal is less than 70 mg/dL.  The goal for HDL is 40 mg/dL-50 mg/dL for men and 50 mg/dL-60 mg/dL for women. An HDL cholesterol of 60 mg/dL or higher gives some protection against heart disease.  The goal for triglycerides is less than 150 mg/dL. Influenza vaccine, pneumococcal vaccine, and hepatitis B vaccine  The influenza vaccine is recommended yearly.  It is recommended that people with diabetes who are over 23 years old get the pneumonia vaccine. In some cases,  two separate shots may be given. Ask your health care provider if your pneumonia vaccination is up to date.  The hepatitis B vaccine is also recommended for adults with diabetes. Diabetes self-management education  Education is recommended at diagnosis and ongoing as needed. Treatment plan  Your treatment plan is reviewed at every medical visit. Document Released: 05/09/2009 Document Revised: 11/26/2013 Document Reviewed: 12/12/2012 [facility] Patient Information 2015 [place], [place]. This information is not intended to replace advice given to you by your health care  provider. Make sure you discuss any questions you have with your health care provider.

## 2015-04-20 NOTE — Progress Notes (Signed)
Subjective:    Patient ID: George Rockers., male    DOB: 1951-01-15, 64 y.o.   MRN: [id number] This chart was scribed for George Cheadle, MD by George Lee, Medical Scribe. This patient was seen in Room 3 and the patient's care was started at 11:22 AM.   Chief Complaint  Patient presents with  . Medication Refill    Metformin  . Immunizations    flu shot     HPI HPI Comments: George E Elijah Phommachanh. is a 64 y.o. male with a history of DM who presents to the [facility] for a medication refill. He is also here for the flu vaccine. He takes a baby aspirin daily. Patient denies any problems with his feet. He sees his eye doctor, Dr. Sherral Hammers, once a year. He is not checking his blood sugars. Patient denies any hypoglycemic warning symptoms.  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  Past Medical History  Diagnosis Date  . DIABETES MELLITUS 09/27/2007    Qualifier: Diagnosis of  By: Harlon Ditty CMA (AAMA), Dottie     Current Outpatient Prescriptions on File Prior to Visit  Medication Sig Dispense Refill  . Calcium Carbonate-Vitamin D (CALCIUM-VITAMIN D) 500-200 MG-UNIT per tablet Take 1 tablet by mouth 2 (two) times daily with a meal.    . calcium-vitamin D (OSCAL WITH D) 500-200 MG-UNIT per tablet Take 1 tablet by mouth 2 (two) times daily.    . ferrous fumarate (HEMOCYTE - 106 MG FE) 325 (106 FE) MG TABS Take 1 tablet by mouth.    . magnesium 30 MG tablet Take 30 mg by mouth 2 (two) times daily.    . Multiple Vitamin (MULTIVITAMIN) tablet Take 1 tablet by mouth daily.     No current facility-administered medications on file prior to visit.   No Known Allergies  Depression screen PHQ 2/9 04/20/2015  Decreased Interest 0  Down, Depressed, Hopeless 0  PHQ - 2 Score 0     Review of Systems  Constitutional: Negative for fever and chills.  Eyes: Negative for visual disturbance.  Respiratory: Negative for shortness of breath.   Cardiovascular: Negative  for chest pain and leg swelling.  Neurological: Negative for dizziness, syncope, facial asymmetry, weakness, light-headedness, numbness and headaches.  Psychiatric/Behavioral: Negative for dysphoric mood. The patient is not nervous/anxious.        Objective:  BP 126/80 mmHg  Pulse 70  Temp(Src) 98.2 F (36.8 C) (Oral)  Resp 17  Ht 5' 8.5" (1.74 m)  Wt 188 lb (85.276 kg)  BMI 28.17 kg/m2  SpO2 98%  Physical Exam  Constitutional: He is oriented to person, place, and time. He appears well-developed and well-nourished. No distress.  HENT:  Head: Normocephalic and atraumatic.  Mouth/Throat: Oropharynx is clear and moist. No oropharyngeal exudate.  Eyes: Pupils are equal, round, and reactive to light.  Neck: Neck supple.  Cardiovascular: Normal rate and regular rhythm.   No murmur heard. Distant heart sounds.  Pulmonary/Chest: Effort normal and breath sounds normal. No respiratory distress. He has no wheezes. He has no rales.  Clear to auscultation bilaterally.   Musculoskeletal: He exhibits no edema.  Neurological: He is alert and oriented to person, place, and time. No cranial nerve deficit.  Skin: Skin is warm and dry. No rash noted.  Psychiatric: He has a normal mood and affect. His behavior is normal.  Nursing note and vitals reviewed.         Assessment &  Plan:  Patient would like [place] to contact Dr. Volanda Napoleon who did his gastic bypass sugery to get a list of the annual routine monitoring labs he should have. Patient is upset because he has tried to get this list from Dr. Volanda Napoleon who has refused to order future labs because he has not seen the patient in 3 years. Call Dr. Loistine Chance office to get labs.  1. Need for prophylactic vaccination and inoculation against influenza   2. Type 2 diabetes mellitus with hyperglycemia - increase metformin from 500 bid to 1000 bid as a1c not at goal -  a1c 8.1 today.  Pt will make an appt for a full physical in the next few mmos and will do fasting  labs at that time.  3. Polypharmacy     Orders Placed This Encounter  Procedures  . Flu Vaccine QUAD 36+ mos IM  . Comprehensive metabolic panel    Order Specific Question:  Has the patient fasted?    Answer:  Yes  . Microalbumin/Creatinine Ratio, Urine  . POCT glycosylated hemoglobin (Hb A1C)    Meds ordered this encounter  Medications  . metFORMIN (GLUCOPHAGE) 1000 MG tablet    Sig: Take 1 tablet (1,000 mg total) by mouth 2 (two) times daily with a meal.    Dispense:  180 tablet    Refill:  3  . losartan (COZAAR) 50 MG tablet    Sig: Take 1 tablet (50 mg total) by mouth daily.    Dispense:  90 tablet    Refill:  3    I personally performed the services described in this documentation, which was scribed in my presence. The recorded information has been reviewed and considered, and addended by me as needed.  George Cheadle, MD MPH   By signing my name below, I, George Lee, attest that this documentation has been prepared under the direction and in the presence of George Cheadle, MD.  Electronically Signed: Zola Lee, Medical Scribe. 04/20/2015. 11:22 AM.  Results for orders placed or performed in visit on 04/20/15  Comprehensive metabolic panel  Result Value Ref Range   Sodium 136 135 - 146 mmol/L   Potassium 4.6 3.5 - 5.3 mmol/L   Chloride 100 98 - 110 mmol/L   CO2 28 20 - 31 mmol/L   Glucose, Bld 159 (H) 65 - 99 mg/dL   BUN 15 7 - 25 mg/dL   Creat 0.80 0.70 - 1.25 mg/dL   Total Bilirubin 0.4 0.2 - 1.2 mg/dL   Alkaline Phosphatase 54 40 - 115 U/L   AST 29 10 - 35 U/L   ALT 30 9 - 46 U/L   Total Protein 6.9 6.1 - 8.1 g/dL   Albumin 4.6 3.6 - 5.1 g/dL   Calcium 9.7 8.6 - 10.3 mg/dL  Microalbumin/Creatinine Ratio, Urine  Result Value Ref Range   Microalb, Ur 0.6 <2.0 mg/dL   Creatinine, Urine 114.1 mg/dL   Microalb Creat Ratio 5.3 0.0 - 30.0 mg/g  POCT glycosylated hemoglobin (Hb A1C)  Result Value Ref Range   Hemoglobin A1C 8.1

## 2015-04-21 LAB — MICROALBUMIN / CREATININE URINE RATIO
CREATININE, URINE: 114.1 mg/dL
MICROALB/CREAT RATIO: 5.3 mg/g (ref 0.0–30.0)
Microalb, Ur: 0.6 mg/dL (ref <2.0)

## 2015-04-22 ENCOUNTER — Encounter: Payer: Self-pay | Admitting: Family Medicine

## 2015-04-23 ENCOUNTER — Encounter: Payer: Self-pay | Admitting: Family Medicine

## 2015-04-23 DIAGNOSIS — E11319 Type 2 diabetes mellitus with unspecified diabetic retinopathy without macular edema: Secondary | ICD-10-CM | POA: Insufficient documentation

## 2015-04-23 DIAGNOSIS — E113299 Type 2 diabetes mellitus with mild nonproliferative diabetic retinopathy without macular edema, unspecified eye: Secondary | ICD-10-CM | POA: Insufficient documentation

## 2015-04-26 ENCOUNTER — Telehealth: Payer: Self-pay | Admitting: Radiology

## 2015-04-26 NOTE — Telephone Encounter (Signed)
Spoke with George Lee who states he is having some chest pain and pressure with extreme exertion today. He also states he has recently been changed to Losartan by Dr. Brigitte Pulse. Per Dr. Lorelei Pont he needed to be seen by the ER for further eval. Pt was unhappy with directions but states he will give it a try. No further questions.

## 2015-05-15 NOTE — Telephone Encounter (Signed)
Patient plans on being seen for a CPE. He requested records from Dr. Jacquiline Doe office to be sent here and they have been received. Office notes scanned into Epic but not the labs. Will wait another week to see if labs will be uploaded and if not will call and request just the labs be refaxed so patient can be seen for CPE. Patient will check back with medical records in about a week.

## 2015-05-25 ENCOUNTER — Telehealth: Payer: Self-pay

## 2015-05-25 DIAGNOSIS — R5383 Other fatigue: Secondary | ICD-10-CM

## 2015-05-25 DIAGNOSIS — I10 Essential (primary) hypertension: Secondary | ICD-10-CM

## 2015-05-25 DIAGNOSIS — E118 Type 2 diabetes mellitus with unspecified complications: Secondary | ICD-10-CM

## 2015-05-25 DIAGNOSIS — Z9884 Bariatric surgery status: Secondary | ICD-10-CM

## 2015-05-25 NOTE — Telephone Encounter (Signed)
The patient stated he is coming in to have blood drawn for presurgical testing on Sunday, June 08, 2015, and would like Dr. Brigitte Pulse to order the necessary labs so that the orders are already in the system when he comes in to get them drawn.  The patient stated he is having gastric bypass surgery and this testing is for that procedure.  The patient may be reached at [phone].

## 2015-05-26 NOTE — Telephone Encounter (Signed)
Did you ever call Dr. Loistine Chance office to see what labs this pt needed? Do we need to do this for you?

## 2015-05-27 NOTE — Telephone Encounter (Signed)
Folate, RBC, Prealbumin, PTH, Vitamin A, Vitamin B12, Vitamin E, CMP, HGBA1C, CBC, Lipid, Ferritin, PSA, Vitamin D He stated it is the same labs Dr. Simone Curia did at [facility]. Scanned in chart under media tab.   And he would also like to add Testosterone and Hep B tests. Is this Ok Dr. Brigitte Pulse? He states he will come in for a physical soon.

## 2015-05-27 NOTE — Telephone Encounter (Signed)
I called Dr. Volanda Napoleon office to see if they can fax me the orders.

## 2015-05-27 NOTE — Telephone Encounter (Signed)
Yes, please have Dr. Loistine Chance office fax over an order for labs with the asstd diagnosis and then enter as future orders under my name but then in comments please note that these are Dr. Loistine Chance orders and to fax to his office. thanks

## 2015-05-28 NOTE — Telephone Encounter (Signed)
Sure. I guess link them to fatigue. . . .   Thanks, let him know that he can have his blood drawn then should come in the next Sun I am in office for his physical so we can review his lab results then. Thanks.

## 2015-05-29 NOTE — Telephone Encounter (Signed)
Future orders placed. Pt will come in this Sunday for blood draw and then in 3 wks for CPE.  He said something about needing a RF of an old BP med, that the new one we prescribed was giving him CP. Not sure what he was talking about. Losartan is the only BP med I can find he's ever been on. He will bring his bottles with him Nancy Fetter when he comes in so we can verify.

## 2015-06-01 ENCOUNTER — Other Ambulatory Visit (INDEPENDENT_AMBULATORY_CARE_PROVIDER_SITE_OTHER): Payer: BLUE CROSS/BLUE SHIELD | Admitting: *Deleted

## 2015-06-01 ENCOUNTER — Telehealth: Payer: Self-pay | Admitting: *Deleted

## 2015-06-01 DIAGNOSIS — Z9884 Bariatric surgery status: Secondary | ICD-10-CM | POA: Diagnosis not present

## 2015-06-01 DIAGNOSIS — E118 Type 2 diabetes mellitus with unspecified complications: Secondary | ICD-10-CM

## 2015-06-01 DIAGNOSIS — R5383 Other fatigue: Secondary | ICD-10-CM

## 2015-06-01 DIAGNOSIS — I10 Essential (primary) hypertension: Secondary | ICD-10-CM

## 2015-06-01 LAB — COMPLETE METABOLIC PANEL WITH GFR
ALT: 23 U/L (ref 9–46)
AST: 21 U/L (ref 10–35)
Albumin: 4.5 g/dL (ref 3.6–5.1)
Alkaline Phosphatase: 60 U/L (ref 40–115)
BUN: 13 mg/dL (ref 7–25)
CALCIUM: 9.7 mg/dL (ref 8.6–10.3)
CHLORIDE: 100 mmol/L (ref 98–110)
CO2: 24 mmol/L (ref 20–31)
CREATININE: 0.87 mg/dL (ref 0.70–1.25)
GFR, Est Non African American: >89 mL/min (ref >=60)
Glucose, Bld: 148 mg/dL — ABNORMAL HIGH (ref 65–99)
POTASSIUM: 4.5 mmol/L (ref 3.5–5.3)
Sodium: 135 mmol/L (ref 135–146)
Total Bilirubin: 0.7 mg/dL (ref 0.2–1.2)
Total Protein: 6.8 g/dL (ref 6.1–8.1)

## 2015-06-01 LAB — CBC
HEMATOCRIT: 43.7 % (ref 39.0–52.0)
HEMOGLOBIN: 15.3 g/dL (ref 13.0–17.0)
MCH: 30.9 pg (ref 26.0–34.0)
MCHC: 35 g/dL (ref 30.0–36.0)
MCV: 88.3 fL (ref 78.0–100.0)
MPV: 9.7 fL (ref 8.6–12.4)
Platelets: 194 10*3/uL (ref 150–400)
RBC: 4.95 MIL/uL (ref 4.22–5.81)
RDW: 13.2 % (ref 11.5–15.5)
WBC: 7.1 10*3/uL (ref 4.0–10.5)

## 2015-06-01 LAB — LIPID PANEL
CHOL/HDL RATIO: 4.2 ratio (ref <=5.0)
CHOLESTEROL: 130 mg/dL (ref 125–200)
HDL: 31 mg/dL — ABNORMAL LOW (ref >=40)
LDL Cholesterol: 47 mg/dL (ref <130)
TRIGLYCERIDES: 260 mg/dL — AB (ref <150)
VLDL: 52 mg/dL — AB (ref <30)

## 2015-06-01 LAB — FERRITIN: Ferritin: 79 ng/mL (ref 22–322)

## 2015-06-01 LAB — HEPATITIS C ANTIBODY: HCV Ab: NEGATIVE

## 2015-06-01 LAB — HEMOGLOBIN A1C
Hgb A1c MFr Bld: 8.4 % — ABNORMAL HIGH (ref <5.7)
MEAN PLASMA GLUCOSE: 194 mg/dL — AB (ref <117)

## 2015-06-01 LAB — VITAMIN B12: VITAMIN B 12: 644 pg/mL (ref 211–911)

## 2015-06-01 MED ORDER — LISINOPRIL-HYDROCHLOROTHIAZIDE 20-25 MG PO TABS: 1.0000 | ORAL_TABLET | Freq: Every day | ORAL | Status: DC

## 2015-06-01 NOTE — Telephone Encounter (Addendum)
That should be fine but he has been rx'ed the lisniopril hctz in our office in the past sev yr - he has been on losartan 50 for the history of epic. Will send in 1 rx for new med so that at his visit next week, we can make sure that he is on the right dose and check on his K and kidneys on this new med.  Also, of course any chest pain could likely be cardiac (or pulmonary) due to med side effect and so should be worked up immediately - if any CP recurs -> 911 and to ER

## 2015-06-01 NOTE — Telephone Encounter (Signed)
Pt does not like taking the losartan because he has chest pain with it and he would like to change back to taking the lisinopril hctz 20/12.5mg .  I advised him that he must come in next Saturday for a CPE and that's when we will give him test results.

## 2015-06-01 NOTE — Telephone Encounter (Signed)
Pt.notified

## 2015-06-01 NOTE — Progress Notes (Signed)
Lab visit only. 

## 2015-06-02 LAB — PTH, INTACT AND CALCIUM
CALCIUM: 10 mg/dL (ref 8.4–10.5)
PTH: 46 pg/mL (ref 14–64)

## 2015-06-02 LAB — TESTOSTERONE, FREE, TOTAL, SHBG
Sex Hormone Binding: 21 nmol/L — ABNORMAL LOW (ref 22–77)
TESTOSTERONE FREE: 103.6 pg/mL (ref 47.0–244.0)
TESTOSTERONE-% FREE: 2.6 % (ref 1.6–2.9)
Testosterone: 403 ng/dL (ref 300–890)

## 2015-06-02 LAB — VITAMIN D 25 HYDROXY (VIT D DEFICIENCY, FRACTURES): Vit D, 25-Hydroxy: 53 ng/mL (ref 30–100)

## 2015-06-02 LAB — PSA: PSA: 5.13 ng/mL — ABNORMAL HIGH (ref <=4.00)

## 2015-06-03 LAB — PREALBUMIN: Prealbumin: 38 mg/dL (ref 21–43)

## 2015-06-03 LAB — FOLATE RBC: RBC FOLATE: 831 ng/mL (ref >280)

## 2015-06-05 LAB — VITAMIN E
Gamma-Tocopherol (Vit E): <1 mg/L (ref <=4.3)
Vitamin E (Alpha Tocopherol): 13.5 mg/L (ref 5.7–19.9)

## 2015-06-05 LAB — VITAMIN A: VITAMIN A (RETINOIC ACID): 64 ug/dL (ref 38–98)

## 2015-06-07 ENCOUNTER — Ambulatory Visit (INDEPENDENT_AMBULATORY_CARE_PROVIDER_SITE_OTHER): Payer: BLUE CROSS/BLUE SHIELD | Admitting: Family Medicine

## 2015-06-07 VITALS — BP 138/72 | HR 76 | Temp 97.9°F | Resp 16 | Ht 69.0 in | Wt 190.0 lb

## 2015-06-07 DIAGNOSIS — Z1383 Encounter for screening for respiratory disorder NEC: Secondary | ICD-10-CM

## 2015-06-07 DIAGNOSIS — I10 Essential (primary) hypertension: Secondary | ICD-10-CM | POA: Diagnosis not present

## 2015-06-07 DIAGNOSIS — Z125 Encounter for screening for malignant neoplasm of prostate: Secondary | ICD-10-CM

## 2015-06-07 DIAGNOSIS — Z113 Encounter for screening for infections with a predominantly sexual mode of transmission: Secondary | ICD-10-CM | POA: Diagnosis not present

## 2015-06-07 DIAGNOSIS — N529 Male erectile dysfunction, unspecified: Secondary | ICD-10-CM | POA: Diagnosis not present

## 2015-06-07 DIAGNOSIS — Z136 Encounter for screening for cardiovascular disorders: Secondary | ICD-10-CM | POA: Diagnosis not present

## 2015-06-07 DIAGNOSIS — R972 Elevated prostate specific antigen [PSA]: Secondary | ICD-10-CM | POA: Diagnosis not present

## 2015-06-07 DIAGNOSIS — E118 Type 2 diabetes mellitus with unspecified complications: Secondary | ICD-10-CM

## 2015-06-07 DIAGNOSIS — Z0000 Encounter for general adult medical examination without abnormal findings: Secondary | ICD-10-CM

## 2015-06-07 DIAGNOSIS — Z1389 Encounter for screening for other disorder: Secondary | ICD-10-CM | POA: Diagnosis not present

## 2015-06-07 LAB — POCT URINALYSIS DIP (MANUAL ENTRY)
Bilirubin, UA: NEGATIVE
Blood, UA: NEGATIVE
GLUCOSE UA: NEGATIVE
LEUKOCYTES UA: NEGATIVE
Nitrite, UA: NEGATIVE
SPEC GRAV UA: 1.02
Urobilinogen, UA: 1
pH, UA: 7

## 2015-06-07 LAB — HEPATITIS A ANTIBODY, TOTAL: Hep A Total Ab: NONREACTIVE

## 2015-06-07 LAB — HEPATITIS B SURFACE ANTIBODY, QUANTITATIVE: Hepatitis B-Post: 0.1 m[IU]/mL

## 2015-06-07 LAB — HIV ANTIBODY (ROUTINE TESTING W REFLEX): HIV: NONREACTIVE

## 2015-06-07 MED ORDER — ZOSTER VACCINE LIVE 19400 UNT/0.65ML ~~LOC~~ SOLR: 0.6500 mL | Freq: Once | SUBCUTANEOUS | Status: DC

## 2015-06-07 MED ORDER — ALPROSTADIL (VASODILATOR) 250 MCG UR PLLT: 250.0000 ug | PELLET | URETHRAL | Status: DC | PRN

## 2015-06-07 MED ORDER — EMPAGLIFLOZIN 10 MG PO TABS: 10.0000 mg | ORAL_TABLET | Freq: Every day | ORAL | Status: DC

## 2015-06-07 MED ORDER — LISINOPRIL-HYDROCHLOROTHIAZIDE 20-12.5 MG PO TABS: 1.0000 | ORAL_TABLET | Freq: Two times a day (BID) | ORAL | Status: DC

## 2015-06-07 NOTE — Patient Instructions (Signed)

## 2015-06-07 NOTE — Progress Notes (Addendum)
Subjective:  This chart was scribed for Delman Cheadle, MD by [facility], medical scribe at [facility].The patient was seen in exam room 03 and the patient's care was started at 10:48 AM.   Patient ID: George Rockers., male    DOB: 1951-04-11, 64 y.o.   MRN: [id number] Chief Complaint  Patient presents with  . Annual Exam   HPI  HPI Comments: George E Drayton Matlock. is a 64 y.o. male who presents to [facility] for an annual physical exam.  Elevated PSA: PSA was elevated at 5.13 at 06/01/2015. Increased urinary frequency, gets up about 2 times a night to urinat but taking hctz in evening.   Hypertension: Chest pressure 10 min after mowing his grass. Believes this maybe up to the change in BP medication, switched from lisinopril to losartan at his last visit. So he switched back to his lisinopril  and no chest pressure when doing every more exercise. He is taking a daily otc potassium.   Diabetes: No issue with his metformin, but A1C is elevated. Concerned about increased weight, considered weight loss medication. No hx of bladder infection.  As a h/o gastric bypass but feels limited by long-distance driving lifestyle.  Health Maintenance: Last tetanus shot was 01/17/2015.Has not gotten the shingles vaccine. Eye doctor every year, no vision changes. Does not see a dentist yearly. Last colonoscopy in 2008 with 2 hyperplastic polyps. No feet concerns other than toenail fungus which failed top trx over a yr. Has chronic peeling foot rash since childhood.  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 Currently can't do any appt other than Sun since out of town during wk.  Past Medical History  Diagnosis Date  . DIABETES MELLITUS 09/27/2007    Qualifier: Diagnosis of  By: Harlon Ditty CMA (AAMA), Dottie     Current Outpatient Prescriptions on File Prior to Visit  Medication Sig Dispense Refill  . Calcium  Carbonate-Vitamin D (CALCIUM-VITAMIN D) 500-200 MG-UNIT per tablet Take 1 tablet by mouth 2 (two) times daily with a meal.    . calcium-vitamin D (OSCAL WITH D) 500-200 MG-UNIT per tablet Take 1 tablet by mouth 2 (two) times daily.    . ferrous fumarate (HEMOCYTE - 106 MG FE) 325 (106 FE) MG TABS Take 1 tablet by mouth.    Marland Kitchen lisinopril-hydrochlorothiazide (PRINZIDE,ZESTORETIC) 20-25 MG tablet Take 1 tablet by mouth daily. 30 tablet 0  . magnesium 30 MG tablet Take 30 mg by mouth 2 (two) times daily.    . metFORMIN (GLUCOPHAGE) 1000 MG tablet Take 1 tablet (1,000 mg total) by mouth 2 (two) times daily with a meal. 180 tablet 3  . Multiple Vitamin (MULTIVITAMIN) tablet Take 1 tablet by mouth daily.     No current facility-administered medications on file prior to visit.   No Known Allergies  Past Surgical History  Procedure Laterality Date  . Gastric bypass  2009  . Salivary gland surgery  2010    stone   Family History  Problem Relation Age of Onset  . Heart disease Mother   . Cancer Father    Social History   Social History  . Marital Status: Divorced    Spouse Name: N/A  . Number of Children: N/A  . Years of Education: N/A   Social History Main Topics  . Smoking status: Never Smoker   . Smokeless tobacco: None  . Alcohol Use:  None  . Drug Use: None  . Sexual Activity: Not Asked   Other Topics Concern  . None   Social History Narrative    Review of Systems  Respiratory: Positive for chest tightness.   Genitourinary: Positive for frequency.  All other systems reviewed and are negative.     Objective:  BP 138/72 mmHg  Pulse 76  Temp(Src) 97.9 F (36.6 C)  Resp 16  Ht 5\' 9"  (1.753 m)  Wt 190 lb (86.183 kg)  BMI 28.05 kg/m2  SpO2 98% Physical Exam  Constitutional: He is oriented to person, place, and time. He appears well-developed and well-nourished. No distress.  HENT:  Head: Normocephalic and atraumatic.  Right Ear: Tympanic membrane normal.  Left Ear:  Tympanic membrane normal.  Mouth/Throat: Oropharynx is clear and moist.  Eyes: Pupils are equal, round, and reactive to light.  Neck: Normal range of motion. No thyromegaly present.  Cardiovascular: Normal rate, regular rhythm, S1 normal, S2 normal and normal heart sounds.   Pulmonary/Chest: Effort normal and breath sounds normal. No respiratory distress.  Abdominal: Soft. Bowel sounds are normal.  Genitourinary: Rectum normal.  Musculoskeletal: Normal range of motion.  Lymphadenopathy:    He has no cervical adenopathy.  Neurological: He is alert and oriented to person, place, and time.  Skin: Skin is warm and dry.  Psychiatric: He has a normal mood and affect. His behavior is normal.  Nursing note and vitals reviewed.  EKG reading: Normal sinus rhythm, no acute ischemic changes.    Assessment & Plan:   1. Annual physical exam   2. Screening for cardiovascular, respiratory, and genitourinary diseases   3. Screening for prostate cancer   4. Elevated prostate specific antigen (PSA) - repeated today and a little higher than 1 wk prior so refer to urology for further eval.  Elevation is very mild at 5.5 so no urgency but should be seen within the next 3 mos or so.  5. Screening for STD (sexually transmitted disease)   6. Type 2 diabetes mellitus with complication, without long-term current use of insulin (HCC)   7. Essential hypertension   8. Erectile dysfunction, unspecified erectile dysfunction type    Pt is NOT immune to hepatitis A or B so he is advised to RTC at his convenience to start both immunization series if he desires.  Orders Placed This Encounter  Procedures  . PSA  . HIV antibody  . Hepatitis B surface antibody  . Hepatitis A antibody, total  . POCT urinalysis dipstick  . EKG 12-Lead    Meds ordered this encounter  Medications  . lisinopril-hydrochlorothiazide (ZESTORETIC) 20-12.5 MG tablet    Sig: Take 1 tablet by mouth 2 (two) times daily.    Dispense:  180  tablet    Refill:  1    Pt prefers loose tabs in bottle - does not want them in the cardboard pill pack.  . empagliflozin (JARDIANCE) 10 MG TABS tablet    Sig: Take 10 mg by mouth daily.    Dispense:  30 tablet    Refill:  5  . zoster vaccine live, PF, (ZOSTAVAX) 24401 UNT/0.65ML injection    Sig: Inject 19,400 Units into the skin once.    Dispense:  1 vial    Refill:  0  . alprostadil (MUSE) 250 MCG pellet    Sig: 1 each (250 mcg total) by Transurethral route as needed for erectile dysfunction. use no more than 3 times per week    Dispense:  6  each    Refill:  12    I personally performed the services described in this documentation, which was scribed in my presence. The recorded information has been reviewed and considered, and addended by me as needed.  Delman Cheadle, MD MPH    By signing my name below, I, Nadim Abuhashem, attest that this documentation has been prepared under the direction and in the presence of Delman Cheadle, MD.  Electronically Signed: Lora Havens, medical scribe. 06/07/2015, 10:49 AM.

## 2015-06-09 LAB — PSA: PSA: 5.5 ng/mL — AB (ref <=4.00)

## 2015-06-16 ENCOUNTER — Encounter: Payer: Self-pay | Admitting: Family Medicine

## 2015-08-15 ENCOUNTER — Encounter: Payer: Self-pay | Admitting: Family Medicine

## 2015-08-20 ENCOUNTER — Encounter: Payer: Self-pay | Admitting: Family Medicine

## 2015-12-04 HISTORY — PX: PROSTATE BIOPSY: SHX241

## 2015-12-06 ENCOUNTER — Other Ambulatory Visit: Payer: Self-pay | Admitting: Family Medicine

## 2015-12-16 ENCOUNTER — Telehealth: Payer: Self-pay

## 2015-12-16 ENCOUNTER — Other Ambulatory Visit: Payer: Self-pay | Admitting: Urology

## 2015-12-16 DIAGNOSIS — E118 Type 2 diabetes mellitus with unspecified complications: Secondary | ICD-10-CM

## 2015-12-16 DIAGNOSIS — C61 Malignant neoplasm of prostate: Secondary | ICD-10-CM

## 2015-12-16 NOTE — Telephone Encounter (Signed)
Patient needs to come in, correct?

## 2015-12-16 NOTE — Telephone Encounter (Signed)
Pt has uncontrolled DM and had some h/o occ chest pressure - needs to be seen and may need cardiology eval prior as well.

## 2015-12-16 NOTE — Telephone Encounter (Signed)
Dr. Brigitte Pulse   [facility] needs surgical clearance for June 30 Prostate Cancer.   Sending over [place].

## 2015-12-17 NOTE — Telephone Encounter (Signed)
Advised pt to come in for evaluation. Pt upset about going to see a cardiologist. I explained in detail why this was important. 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 I am not sure if you are working on this day. I can put in referral for cardiology and see if they can fit him in somewhere because he doesn't have one at this time. Please advise.

## 2015-12-19 NOTE — Telephone Encounter (Signed)
Pt aware of Dr. Raul Del recommendations via phone. He is also aware that she will not be in the office on 01/01/16.

## 2015-12-19 NOTE — Telephone Encounter (Signed)
On review of his chart, I don't think he will necessarily require preoperative cardiology evaluation as long as he is not developing worsening shortness of breath with exercise, chest pain, or chest tightness (he was having chest tightness w/ mowing lawn last fall but resolved after we changed his bp med - if anything like this has recurred at all he should let us know asap so we can try to arrange for cardiology eval that is convenient for him). Also, for him to stay at low risk for surgical complications, we need to make sure his DM is doing better, BP great, kidneys stable which is what will be done at his visit. We may also need to consider changing him off of his DM med jardiance prior to prostate surgery since it would seem to me that the high levels of glucose in the urine could increase his risk for infection and poor healing during prostate surgery but I do not know that for sure since it is a newer med so we may need to discuss this with his urologist depending on how his DM and med tolerance is doing otherwise. I will NOT be here on Mon June 5th so he will have to see one of my colleagues.

## 2015-12-29 ENCOUNTER — Ambulatory Visit (INDEPENDENT_AMBULATORY_CARE_PROVIDER_SITE_OTHER): Payer: BLUE CROSS/BLUE SHIELD | Admitting: Emergency Medicine

## 2015-12-29 ENCOUNTER — Other Ambulatory Visit: Payer: Self-pay | Admitting: Emergency Medicine

## 2015-12-29 VITALS — BP 118/64 | HR 76 | Temp 97.6°F | Resp 18 | Ht 69.0 in | Wt 191.4 lb

## 2015-12-29 DIAGNOSIS — E118 Type 2 diabetes mellitus with unspecified complications: Secondary | ICD-10-CM

## 2015-12-29 DIAGNOSIS — I493 Ventricular premature depolarization: Secondary | ICD-10-CM | POA: Diagnosis not present

## 2015-12-29 DIAGNOSIS — R030 Elevated blood-pressure reading, without diagnosis of hypertension: Secondary | ICD-10-CM

## 2015-12-29 DIAGNOSIS — Z9884 Bariatric surgery status: Secondary | ICD-10-CM

## 2015-12-29 DIAGNOSIS — IMO0001 Reserved for inherently not codable concepts without codable children: Secondary | ICD-10-CM

## 2015-12-29 DIAGNOSIS — C61 Malignant neoplasm of prostate: Secondary | ICD-10-CM

## 2015-12-29 LAB — POCT GLYCOSYLATED HEMOGLOBIN (HGB A1C): HEMOGLOBIN A1C: 7

## 2015-12-29 LAB — BASIC METABOLIC PANEL WITH GFR
BUN: 13 mg/dL (ref 7–25)
CHLORIDE: 99 mmol/L (ref 98–110)
CO2: 24 mmol/L (ref 20–31)
CREATININE: 0.65 mg/dL — AB (ref 0.70–1.25)
Calcium: 9.3 mg/dL (ref 8.6–10.3)
GFR, Est African American: >89 mL/min (ref >=60)
GFR, Est Non African American: >89 mL/min (ref >=60)
GLUCOSE: 130 mg/dL — AB (ref 65–99)
Potassium: 4.4 mmol/L (ref 3.5–5.3)
Sodium: 135 mmol/L (ref 135–146)

## 2015-12-29 LAB — GLUCOSE, POCT (MANUAL RESULT ENTRY): POC Glucose: 127 mg/dl — AB (ref 70–99)

## 2015-12-29 NOTE — Patient Instructions (Addendum)
I have made you an appointment to see the cardiologist for further evaluation and clearance. You are otherwise medically cleared for surgery.    IF you received an x-ray today, you will receive an invoice from [facility]. Please contact [facility] at [phone] with questions or concerns regarding your invoice.   IF you received labwork today, you will receive an invoice from [employer]. Please contact [facility] at [phone] with questions or concerns regarding your invoice.   Our billing staff will not be able to assist you with questions regarding bills from these companies.  You will be contacted with the lab results as soon as they are available. The fastest way to get your results is to activate your My Chart account. Instructions are located on the last page of this paperwork. If you have not heard from [place] regarding the results in 2 weeks, please contact this office.

## 2015-12-29 NOTE — Progress Notes (Signed)
Subjective:  This chart was scribed for Arlyss Queen MD, by Tamsen Roers, at [facility].  This patient was seen in room 2 and the patient's care was started at 12:32 PM.   Chief Complaint  Patient presents with  . surgical clearance     Patient ID: George Rockers., male    DOB: 01-05-1951, 65 y.o.   MRN: [id number]  HPI HPI Comments: George E Toshiro Ohta. is a 65 y.o. male with a history of prostate cancer who presents to the [facility] for surgical clearance. Patient will be having a robotic assisted laprascopic radical prostatectomy with bilateral pelvic lymph node dissection by Dr. Louis Meckel on 02/01/23. Patient's father died of cancer at 76.  He currently does not have a sex life but is worried about incontinence after the surgery. Patient has had gastric bypass surgery 8 years ago.  He did not have any difficulty after the surgery.  Patient has had a saliva gland removed in the past due to stones.  He has never had difficulty with anesthesia after her previous surgeries. Patient drives a truck for work.   Patient is usually seen by Dr. Brigitte Lee. He has never had a stress test in the past or seen a cardiologist.  He had an EKG competed by Dr. Brigitte Lee which did not show any concerns.  Patient has a history of high blood pressure and diabetes.  He controls his blood pressure but states that he could control his diabetes better. He states that he does drink "more than he should".    His last A1C was 8.30 May 2015 EKG 05/2015 Occasional PAC.     Patient Active Problem List   Diagnosis Date Noted  . Controlled type 2 diabetes mellitus with nonproliferative diabetic retinopathy 04/23/2015  . Diabetic retinopathy associated with type 2 diabetes mellitus ([place]) 04/23/2015  . Coccyxdynia 09/11/2012  . COLONIC POLYPS 09/27/2007  . DIVERTICULOSIS, SIGMOID COLON 09/27/2007  . SLEEP APNEA 09/27/2007   Past Medical History  Diagnosis Date  .  DIABETES MELLITUS 09/27/2007    Qualifier: Diagnosis of  By: Harlon Ditty CMA (AAMA), Dottie     Past Surgical History  Procedure Laterality Date  . Gastric bypass  2009  . Salivary gland surgery  2010    stone   Allergies  Allergen Reactions  . Losartan Shortness Of Breath   Prior to Admission medications   Medication Sig Start Date End Date Taking? Authorizing Provider  Calcium Carbonate-Vitamin D (CALCIUM-VITAMIN D) 500-200 MG-UNIT per tablet Take 1 tablet by mouth 2 (two) times daily with a meal.   Yes Historical Provider, MD  calcium-vitamin D (OSCAL WITH D) 500-200 MG-UNIT per tablet Take 1 tablet by mouth 2 (two) times daily.   Yes Historical Provider, MD  lisinopril-hydrochlorothiazide (PRINZIDE,ZESTORETIC) 20-12.5 MG tablet TAKE ONE TABLET BY MOUTH TWICE DAILY 12/08/15  Yes Wardell Honour, MD  magnesium 30 MG tablet Take 30 mg by mouth 2 (two) times daily.   Yes Historical Provider, MD  metFORMIN (GLUCOPHAGE) 1000 MG tablet Take 1 tablet (1,000 mg total) by mouth 2 (two) times daily with a meal. 04/20/15  Yes Shawnee Knapp, MD  Multiple Vitamin (MULTIVITAMIN) tablet Take 1 tablet by mouth daily.   Yes Historical Provider, MD  zoster vaccine live, PF, (ZOSTAVAX) 16109 UNT/0.65ML injection Inject 19,400 Units into the skin once. Patient not taking: Reported on 12/29/2015 06/07/15   Shawnee Knapp, MD   Social History  Social History  . Marital Status: Divorced    Spouse Name: N/A  . Number of Children: N/A  . Years of Education: N/A   Occupational History  . Not on file.   Social History Main Topics  . Smoking status: Never Smoker   . Smokeless tobacco: Not on file  . Alcohol Use: Not on file  . Drug Use: Not on file  . Sexual Activity: Not on file   Other Topics Concern  . Not on file   Social History Narrative       Review of Systems  Constitutional: Negative for fever and chills.  Eyes: Negative for pain, redness and itching.  Respiratory: Negative for cough and  shortness of breath.   Gastrointestinal: Negative for nausea and vomiting.  Musculoskeletal: Negative for neck pain and neck stiffness.  Skin: Negative for color change.  Neurological: Negative for seizures, syncope and speech difficulty.       Objective:   Physical Exam  Filed Vitals:   12/29/15 1053  BP: 118/64  Lee: 76  Temp: 97.6 F (36.4 C)  TempSrc: Oral  Resp: 18  Height: 5\' 9"  (1.753 m)  Weight: 191 lb 6.4 oz (86.818 kg)  SpO2: 99%     CONSTITUTIONAL: Well developed/well nourished HEAD: Normocephalic/atraumatic EYES: EOMI/PERRL ENMT: Mucous membranes moist NECK: supple no meningeal signs SPINE/BACK:entire spine nontender CV: S1/S2 noted, no murmurs/rubs/gallops noted LUNGS: Lungs are clear to auscultation bilaterally, no apparent distress ABDOMEN: soft, nontender, no rebound or guarding, bowel sounds noted throughout abdomen GU:no cva tenderness NEURO: Pt is awake/alert/appropriate, moves all extremitiesx4.  No facial droop.   EXTREMITIES: pulses normal/equal, full ROM SKIN: warm, color normal PSYCH: no abnormalities of mood noted, alert and oriented to situation  EKG: frequent PVC's, t-wave changes, 3 and  AVF with Q wave in AVF. -- I had the EKG repeated and it appeared completely normal except for frequent PVC's.   Results for orders placed or performed in visit on 12/29/15  POCT glucose (manual entry)  Result Value Ref Range   POC Glucose 127 (A) 70 - 99 mg/dl  POCT glycosylated hemoglobin (Hb A1C)  Result Value Ref Range   Hemoglobin A1C 7.0        Assessment & Plan:  Patient's hemoglobin A1c is down to 7. Blood pressure is under control. I did go ahead and make a referral to cardiology to get cardiology clearance because patient has hypertension diabetes and had PVCs on his EKG. Referral made to Dr. Einar Gip  for this.I personally performed the services described in this documentation, which was scribed in my presence. The recorded information has  been reviewed and is accurate. When I went back and spoke with the patient he said a year ago while he was mowing the lawn he would have chest tightness and have to stop. This has not been present for the last year and each time he mowed his yard this year he did not have any discomfort. I do think cardiology evaluation and stress testing is definitely indicated.I personally performed the services described in this documentation, which was scribed in my presence. The recorded information has been reviewed and is accurate.

## 2015-12-29 NOTE — Telephone Encounter (Signed)
Patient was called and given the appt time/day for his visit with Dr. Einar Gip.  He was given instructions on what to bring as well the address. Patient read back the date/time and gave full understanding.

## 2015-12-31 LAB — LIPID PANEL
CHOLESTEROL: 175 mg/dL (ref 125–200)
HDL: 42 mg/dL (ref >=40)
LDL Cholesterol: 62 mg/dL (ref <130)
Total CHOL/HDL Ratio: 4.2 Ratio (ref <=5.0)
Triglycerides: 355 mg/dL — ABNORMAL HIGH (ref <150)
VLDL: 71 mg/dL — AB (ref <30)

## 2016-01-02 ENCOUNTER — Telehealth: Payer: Self-pay | Admitting: Emergency Medicine

## 2016-01-02 NOTE — Telephone Encounter (Signed)
-----   Message from Darlyne Russian, MD sent at 01/01/2016  7:32 AM EDT ----- Triglycerides are elevated and will improve as he gets better control of his diabetes. Please forward a copy to Dr. Einar Gip

## 2016-01-02 NOTE — Telephone Encounter (Signed)
Pt given lab results Will forward copy to Dr. Einar Gip

## 2016-01-09 ENCOUNTER — Other Ambulatory Visit (HOSPITAL_COMMUNITY): Payer: Self-pay | Admitting: *Deleted

## 2016-01-09 NOTE — Progress Notes (Signed)
EKG 12-29-15 EPIC

## 2016-01-09 NOTE — Patient Instructions (Addendum)
George Lee.  01/09/2016   Your procedure is scheduled on: 02/02/2016  Report to [place]  Entrance take [facility]  elevators to 3rd floor to  [place] at 1000 AM.  Call this number if you have problems the morning of surgery [phone]   Remember: ONLY 1 PERSON MAY GO WITH YOU TO SHORT STAY TO GET  READY MORNING OF [place].  Do not eat food  :After Midnight, Wednesday NIGHT CLEAR LIQUIDS ALL DAY Thursday PER DR HERRICK INSTRUCTIONS, NOTHING BY MOUTH AFTER MIDNIGHT Thursday NIGHT, FOLLOW ALL BOWEL REP INSTRUCTIONS FROM DR Louis Meckel.     Take these medicines the morning of surgery with A SIP OF WATER: NONE DO NOT TAKE ANY DIABETIC MEDICATIONS DAY OF YOUR SURGERY                               You may not have any metal on your body including hair pins and              piercings  Do not wear jewelry, make-up, lotions, powders or perfumes, deodorant             Do not wear nail polish.  Do not shave  48 hours prior to surgery.              Men may shave face and neck.   Do not bring valuables to the hospital. [place].  Contacts, dentures or bridgework may not be worn into surgery.  Leave suitcase in the car. After surgery it may be brought to your room.                Please read over the following fact sheets you were given: _____________________________________________________________________                CLEAR LIQUID DIET   Foods Allowed                                                                     Foods Excluded  Coffee and tea, regular and decaf                             liquids that you cannot  Plain Jell-O in any flavor                                             see through such as: Fruit ices (not with fruit pulp)                                     milk, soups, orange juice  Iced Popsicles  All solid food Carbonated beverages,  regular and diet                                    Cranberry, grape and apple juices Sports drinks like Gatorade Lightly seasoned clear broth or consume(fat free) Sugar, honey syrup  Sample Menu Breakfast                                Lunch                                     Supper Cranberry juice                    Beef broth                            Chicken broth Jell-O                                     Grape juice                           Apple juice Coffee or tea                        Jell-O                                      Popsicle                                                Coffee or tea                        Coffee or tea  _____________________________________________________________________  [facility] - Preparing for Surgery Before surgery, you can play an important role.  Because skin is not sterile, your skin needs to be as free of germs as possible.  You can reduce the number of germs on your skin by washing with CHG (chlorahexidine gluconate) soap before surgery.  CHG is an antiseptic cleaner which kills germs and bonds with the skin to continue killing germs even after washing. Please DO NOT use if you have an allergy to CHG or antibacterial soaps.  If your skin becomes reddened/irritated stop using the CHG and inform your nurse when you arrive at Short Stay. Do not shave (including legs and underarms) for at least 48 hours prior to the first CHG shower.  You may shave your face/neck. Please follow these instructions carefully:  1.  Shower with CHG Soap the night before surgery and the  morning of Surgery.  2.  If you choose to wash your hair, wash your hair first as usual with your  normal  shampoo.  3.  After you shampoo, rinse your hair and body thoroughly to remove the  shampoo.  4.  Use CHG as you would any other liquid soap.  You can apply chg directly  to the skin and wash                       Gently with a scrungie or clean  washcloth.  5.  Apply the CHG Soap to your body ONLY FROM THE NECK DOWN.   Do not use on face/ open                           Wound or open sores. Avoid contact with eyes, ears mouth and genitals (private parts).                       Wash face,  Genitals (private parts) with your normal soap.             6.  Wash thoroughly, paying special attention to the area where your surgery  will be performed.  7.  Thoroughly rinse your body with warm water from the neck down.  8.  DO NOT shower/wash with your normal soap after using and rinsing off  the CHG Soap.                9.  Pat yourself dry with a clean towel.            10.  Wear clean pajamas.            11.  Place clean sheets on your bed the night of your first shower and do not  sleep with pets. Day of Surgery : Do not apply any lotions/deodorants the morning of surgery.  Please wear clean clothes to the hospital/surgery center.  FAILURE TO FOLLOW THESE INSTRUCTIONS MAY RESULT IN THE CANCELLATION OF YOUR SURGERY PATIENT SIGNATURE_________________________________  NURSE SIGNATURE__________________________________  ________________________________________________________________________  WHAT IS A BLOOD TRANSFUSION? Blood Transfusion Information  A transfusion is the replacement of blood or some of its parts. Blood is made up of multiple cells which provide different functions.  Red blood cells carry oxygen and are used for blood loss replacement.  White blood cells fight against infection.  Platelets control bleeding.  Plasma helps clot blood.  Other blood products are available for specialized needs, such as hemophilia or other clotting disorders. BEFORE THE TRANSFUSION  Who gives blood for transfusions?   Healthy volunteers who are fully evaluated to make sure their blood is safe. This is blood bank blood. Transfusion therapy is the safest it has ever been in the practice of medicine. Before blood is taken from a donor, a  complete history is taken to make sure that person has no history of diseases nor engages in risky social behavior (examples are intravenous drug use or sexual activity with multiple partners). The donor's travel history is screened to minimize risk of transmitting infections, such as malaria. The donated blood is tested for signs of infectious diseases, such as HIV and hepatitis. The blood is then tested to be sure it is compatible with you in order to minimize the chance of a transfusion reaction. If you or a relative donates blood, this is often done in anticipation of surgery and is not appropriate for emergency situations. It takes many days to process the donated blood. RISKS AND COMPLICATIONS Although transfusion therapy is very safe and saves many lives, the main dangers of transfusion include:   Getting an infectious disease.  Developing a transfusion reaction.  This is an allergic reaction to something in the blood you were given. Every precaution is taken to prevent this. The decision to have a blood transfusion has been considered carefully by your caregiver before blood is given. Blood is not given unless the benefits outweigh the risks. AFTER THE TRANSFUSION  Right after receiving a blood transfusion, you will usually feel much better and more energetic. This is especially true if your red blood cells have gotten low (anemic). The transfusion raises the level of the red blood cells which carry oxygen, and this usually causes an energy increase.  The nurse administering the transfusion will monitor you carefully for complications. HOME CARE INSTRUCTIONS  No special instructions are needed after a transfusion. You may find your energy is better. Speak with your caregiver about any limitations on activity for underlying diseases you may have. SEEK MEDICAL CARE IF:   Your condition is not improving after your transfusion.  You develop redness or irritation at the intravenous (IV)  site. SEEK IMMEDIATE MEDICAL CARE IF:  Any of the following symptoms occur over the next 12 hours:  Shaking chills.  You have a temperature by mouth above 102 F (38.9 C), not controlled by medicine.  Chest, back, or muscle pain.  People around you feel you are not acting correctly or are confused.  Shortness of breath or difficulty breathing.  Dizziness and fainting.  You get a rash or develop hives.  You have a decrease in urine output.  Your urine turns a dark color or changes to pink, red, or brown. Any of the following symptoms occur over the next 10 days:  You have a temperature by mouth above 102 F (38.9 C), not controlled by medicine.  Shortness of breath.  Weakness after normal activity.  The white part of the eye turns yellow (jaundice).  You have a decrease in the amount of urine or are urinating less often.  Your urine turns a dark color or changes to pink, red, or brown. Document Released: 07/09/2000 Document Revised: 10/04/2011 Document Reviewed: 02/26/2008 [facility] Patient Information 2014 [place], [place].  _______________________________________________________________________

## 2016-01-12 ENCOUNTER — Ambulatory Visit (HOSPITAL_COMMUNITY)
Admission: RE | Admit: 2016-01-12 | Discharge: 2016-01-12 | Disposition: A | Discharge status: Home / Self Care | Payer: BLUE CROSS/BLUE SHIELD | Source: Ambulatory Visit | Attending: Urology | Admitting: Urology

## 2016-01-12 ENCOUNTER — Encounter (HOSPITAL_COMMUNITY)
Admission: RE | Admit: 2016-01-12 | Discharge: 2016-01-12 | Disposition: A | Discharge status: Home / Self Care | Payer: BLUE CROSS/BLUE SHIELD | Source: Ambulatory Visit | Attending: Urology | Admitting: Urology

## 2016-01-12 ENCOUNTER — Encounter (HOSPITAL_COMMUNITY): Payer: Self-pay

## 2016-01-12 DIAGNOSIS — R590 Localized enlarged lymph nodes: Secondary | ICD-10-CM | POA: Diagnosis not present

## 2016-01-12 DIAGNOSIS — C61 Malignant neoplasm of prostate: Secondary | ICD-10-CM | POA: Diagnosis not present

## 2016-01-12 HISTORY — DX: Unspecified osteoarthritis, unspecified site: M19.90

## 2016-01-12 HISTORY — DX: Pain in unspecified joint: M25.50

## 2016-01-12 HISTORY — DX: Essential (primary) hypertension: I10

## 2016-01-12 HISTORY — DX: Cardiac arrhythmia, unspecified: I49.9

## 2016-01-12 HISTORY — DX: Malignant (primary) neoplasm, unspecified: C80.1

## 2016-01-12 LAB — CBC
HCT: 39 % (ref 39.0–52.0)
HEMOGLOBIN: 13.7 g/dL (ref 13.0–17.0)
MCH: 30.9 pg (ref 26.0–34.0)
MCHC: 35.1 g/dL (ref 30.0–36.0)
MCV: 87.8 fL (ref 78.0–100.0)
PLATELETS: 182 10*3/uL (ref 150–400)
RBC: 4.44 MIL/uL (ref 4.22–5.81)
RDW: 13.3 % (ref 11.5–15.5)
WBC: 8.5 10*3/uL (ref 4.0–10.5)

## 2016-01-12 LAB — COMPREHENSIVE METABOLIC PANEL
ALBUMIN: 4.4 g/dL (ref 3.5–5.0)
ALT: 30 U/L (ref 17–63)
AST: 26 U/L (ref 15–41)
Alkaline Phosphatase: 54 U/L (ref 38–126)
Anion gap: 6 (ref 5–15)
BUN: 22 mg/dL — AB (ref 6–20)
CHLORIDE: 105 mmol/L (ref 101–111)
CO2: 26 mmol/L (ref 22–32)
CREATININE: 0.98 mg/dL (ref 0.61–1.24)
Calcium: 9.3 mg/dL (ref 8.9–10.3)
GFR calc Af Amer: >60 mL/min (ref >60)
GFR calc non Af Amer: >60 mL/min (ref >60)
Glucose, Bld: 248 mg/dL — ABNORMAL HIGH (ref 65–99)
Potassium: 4.6 mmol/L (ref 3.5–5.1)
SODIUM: 137 mmol/L (ref 135–145)
Total Bilirubin: 0.5 mg/dL (ref 0.3–1.2)
Total Protein: 6.5 g/dL (ref 6.5–8.1)

## 2016-01-12 LAB — SURGICAL PCR SCREEN
MRSA, PCR: NEGATIVE
Staphylococcus aureus: NEGATIVE

## 2016-01-12 LAB — TYPE AND SCREEN
ABO/RH(D): A POS
ANTIBODY SCREEN: NEGATIVE

## 2016-01-12 LAB — ABO/RH: ABO/RH(D): A POS

## 2016-01-12 MED ORDER — GADOBENATE DIMEGLUMINE 529 MG/ML IV SOLN
20.0000 mL | Freq: Once | INTRAVENOUS | Status: AC | PRN
  Administered 2016-01-12: 18 mL via INTRAVENOUS

## 2016-01-13 LAB — HEMOGLOBIN A1C
Hgb A1c MFr Bld: 6.8 % — ABNORMAL HIGH (ref 4.8–5.6)
MEAN PLASMA GLUCOSE: 148 mg/dL

## 2016-01-14 NOTE — Progress Notes (Signed)
LOV /CARDIAC CLEARANCE NOTE DR Einar Gip 01-13-16 ON CHART EKG 12-31-15 DR Einar Gip ON CHART  ECHO 01-06-16 DR Einar Gip ON CHART

## 2016-01-19 ENCOUNTER — Encounter (HOSPITAL_COMMUNITY): Payer: Self-pay | Admitting: Cardiology

## 2016-01-19 ENCOUNTER — Ambulatory Visit (HOSPITAL_COMMUNITY)
Admission: RE | Admit: 2016-01-19 | Discharge: 2016-01-19 | Disposition: A | Discharge status: Home / Self Care | Payer: BLUE CROSS/BLUE SHIELD | Source: Ambulatory Visit | Attending: Cardiology | Admitting: Cardiology

## 2016-01-19 ENCOUNTER — Encounter (HOSPITAL_COMMUNITY)
Admission: RE | Disposition: A | Discharge status: Home / Self Care | Payer: Self-pay | Source: Ambulatory Visit | Attending: Cardiology

## 2016-01-19 ENCOUNTER — Encounter: Payer: Self-pay | Admitting: Family Medicine

## 2016-01-19 DIAGNOSIS — R9439 Abnormal result of other cardiovascular function study: Secondary | ICD-10-CM | POA: Diagnosis present

## 2016-01-19 DIAGNOSIS — Z7982 Long term (current) use of aspirin: Secondary | ICD-10-CM | POA: Insufficient documentation

## 2016-01-19 DIAGNOSIS — E113299 Type 2 diabetes mellitus with mild nonproliferative diabetic retinopathy without macular edema, unspecified eye: Secondary | ICD-10-CM | POA: Diagnosis not present

## 2016-01-19 DIAGNOSIS — C61 Malignant neoplasm of prostate: Secondary | ICD-10-CM | POA: Insufficient documentation

## 2016-01-19 DIAGNOSIS — I10 Essential (primary) hypertension: Secondary | ICD-10-CM | POA: Diagnosis not present

## 2016-01-19 DIAGNOSIS — I493 Ventricular premature depolarization: Secondary | ICD-10-CM | POA: Insufficient documentation

## 2016-01-19 DIAGNOSIS — E781 Pure hyperglyceridemia: Secondary | ICD-10-CM | POA: Insufficient documentation

## 2016-01-19 DIAGNOSIS — I25119 Atherosclerotic heart disease of native coronary artery with unspecified angina pectoris: Secondary | ICD-10-CM | POA: Insufficient documentation

## 2016-01-19 DIAGNOSIS — Z79899 Other long term (current) drug therapy: Secondary | ICD-10-CM | POA: Insufficient documentation

## 2016-01-19 DIAGNOSIS — E1165 Type 2 diabetes mellitus with hyperglycemia: Secondary | ICD-10-CM | POA: Diagnosis present

## 2016-01-19 DIAGNOSIS — Z7984 Long term (current) use of oral hypoglycemic drugs: Secondary | ICD-10-CM | POA: Diagnosis not present

## 2016-01-19 DIAGNOSIS — I2510 Atherosclerotic heart disease of native coronary artery without angina pectoris: Secondary | ICD-10-CM | POA: Insufficient documentation

## 2016-01-19 DIAGNOSIS — I2584 Coronary atherosclerosis due to calcified coronary lesion: Secondary | ICD-10-CM | POA: Insufficient documentation

## 2016-01-19 DIAGNOSIS — I209 Angina pectoris, unspecified: Secondary | ICD-10-CM | POA: Diagnosis present

## 2016-01-19 HISTORY — PX: CARDIAC CATHETERIZATION: SHX172

## 2016-01-19 LAB — PROTIME-INR
INR: 1.08 (ref 0.00–1.49)
Prothrombin Time: 14.2 seconds (ref 11.6–15.2)

## 2016-01-19 LAB — GLUCOSE, CAPILLARY: GLUCOSE-CAPILLARY: 144 mg/dL — AB (ref 65–99)

## 2016-01-19 SURGERY — LEFT HEART CATH AND CORONARY ANGIOGRAPHY
Anesthesia: LOCAL

## 2016-01-19 MED ORDER — SODIUM CHLORIDE 0.9 % WEIGHT BASED INFUSION
3.0000 mL/kg/h | INTRAVENOUS | Status: DC
  Administered 2016-01-19: 3 mL/kg/h via INTRAVENOUS

## 2016-01-19 MED ORDER — LIDOCAINE HCL (PF) 1 % IJ SOLN
INTRAMUSCULAR | Status: DC | PRN
  Administered 2016-01-19: 2 mL

## 2016-01-19 MED ORDER — SODIUM CHLORIDE 0.9 % WEIGHT BASED INFUSION: 1.0000 mL/kg/h | INTRAVENOUS | Status: DC

## 2016-01-19 MED ORDER — METFORMIN HCL 1000 MG PO TABS: 1000.0000 mg | ORAL_TABLET | Freq: Two times a day (BID) | ORAL | Status: AC

## 2016-01-19 MED ORDER — ASPIRIN 81 MG PO CHEW
81.0000 mg | CHEWABLE_TABLET | ORAL | Status: AC
  Administered 2016-01-19: 81 mg via ORAL

## 2016-01-19 MED ORDER — VERAPAMIL HCL 2.5 MG/ML IV SOLN
INTRAVENOUS | Status: AC
  Filled 2016-01-19: qty 2

## 2016-01-19 MED ORDER — ASPIRIN 81 MG PO CHEW
CHEWABLE_TABLET | ORAL | Status: AC
  Filled 2016-01-19: qty 1

## 2016-01-19 MED ORDER — IOPAMIDOL (ISOVUE-370) INJECTION 76%
INTRAVENOUS | Status: AC
  Filled 2016-01-19: qty 100

## 2016-01-19 MED ORDER — SODIUM CHLORIDE 0.9% FLUSH: 3.0000 mL | Freq: Two times a day (BID) | INTRAVENOUS | Status: DC

## 2016-01-19 MED ORDER — HYDROMORPHONE HCL 1 MG/ML IJ SOLN
INTRAMUSCULAR | Status: AC
  Filled 2016-01-19: qty 1

## 2016-01-19 MED ORDER — VERAPAMIL HCL 2.5 MG/ML IV SOLN
INTRA_ARTERIAL | Status: DC | PRN
  Administered 2016-01-19: 08:00:00 via INTRA_ARTERIAL

## 2016-01-19 MED ORDER — SODIUM CHLORIDE 0.9% FLUSH: 3.0000 mL | INTRAVENOUS | Status: DC | PRN

## 2016-01-19 MED ORDER — HEPARIN (PORCINE) IN NACL 2-0.9 UNIT/ML-% IJ SOLN
INTRAMUSCULAR | Status: AC
  Filled 2016-01-19: qty 1000

## 2016-01-19 MED ORDER — MIDAZOLAM HCL 2 MG/2ML IJ SOLN
INTRAMUSCULAR | Status: AC
  Filled 2016-01-19: qty 2

## 2016-01-19 MED ORDER — HEPARIN SODIUM (PORCINE) 1000 UNIT/ML IJ SOLN
INTRAMUSCULAR | Status: DC | PRN
  Administered 2016-01-19: 4500 [IU] via INTRAVENOUS

## 2016-01-19 MED ORDER — HEPARIN SODIUM (PORCINE) 1000 UNIT/ML IJ SOLN
INTRAMUSCULAR | Status: AC
  Filled 2016-01-19: qty 1

## 2016-01-19 MED ORDER — NITROGLYCERIN 1 MG/10 ML FOR IR/CATH LAB
INTRA_ARTERIAL | Status: AC
  Filled 2016-01-19: qty 10

## 2016-01-19 MED ORDER — ISOSORBIDE MONONITRATE ER 60 MG PO TB24: 60.0000 mg | ORAL_TABLET | Freq: Every day | ORAL | Status: AC

## 2016-01-19 MED ORDER — MIDAZOLAM HCL 2 MG/2ML IJ SOLN
INTRAMUSCULAR | Status: DC | PRN
  Administered 2016-01-19: 2 mg via INTRAVENOUS
  Administered 2016-01-19: 1 mg via INTRAVENOUS

## 2016-01-19 MED ORDER — SODIUM CHLORIDE 0.9 % IV SOLN: 250.0000 mL | INTRAVENOUS | Status: DC | PRN

## 2016-01-19 MED ORDER — SODIUM CHLORIDE 0.9 % WEIGHT BASED INFUSION: 3.0000 mL/kg/h | INTRAVENOUS | Status: AC

## 2016-01-19 MED ORDER — ATORVASTATIN CALCIUM 80 MG PO TABS: 80.0000 mg | ORAL_TABLET | Freq: Every day | ORAL | Status: AC

## 2016-01-19 MED ORDER — HYDROMORPHONE HCL 1 MG/ML IJ SOLN
INTRAMUSCULAR | Status: DC | PRN
  Administered 2016-01-19 (×2): 0.5 mg via INTRAVENOUS

## 2016-01-19 MED ORDER — LIDOCAINE HCL (PF) 1 % IJ SOLN
INTRAMUSCULAR | Status: AC
  Filled 2016-01-19: qty 30

## 2016-01-19 MED ORDER — IOPAMIDOL (ISOVUE-370) INJECTION 76%
INTRAVENOUS | Status: DC | PRN
  Administered 2016-01-19: 90 mL via INTRAVENOUS

## 2016-01-19 MED ORDER — METOPROLOL SUCCINATE ER 25 MG PO TB24: 25.0000 mg | ORAL_TABLET | Freq: Two times a day (BID) | ORAL | Status: AC

## 2016-01-19 MED ORDER — HEPARIN (PORCINE) IN NACL 2-0.9 UNIT/ML-% IJ SOLN
INTRAMUSCULAR | Status: DC | PRN
  Administered 2016-01-19: 1000 mL

## 2016-01-19 SURGICAL SUPPLY — 8 items
CATH OPTITORQUE TIG 4.0 5F (CATHETERS) ×2 IMPLANT
DEVICE RAD COMP TR BAND LRG (VASCULAR PRODUCTS) ×2 IMPLANT
GLIDESHEATH SLEND A-KIT 6F 20G (SHEATH) ×2 IMPLANT
KIT HEART LEFT (KITS) ×2 IMPLANT
PACK CARDIAC CATHETERIZATION (CUSTOM PROCEDURE TRAY) ×2 IMPLANT
TRANSDUCER W/STOPCOCK (MISCELLANEOUS) ×2 IMPLANT
TUBING CIL FLEX 10 FLL-RA (TUBING) ×2 IMPLANT
WIRE SAFE-T 1.5MM-J .035X260CM (WIRE) ×2 IMPLANT

## 2016-01-19 NOTE — Interval H&P Note (Signed)
History and Physical Interval Note:  01/19/2016 7:49 AM  George Lee.  has presented today for surgery, with the diagnosis of Abnormal Stress Test  The various methods of treatment have been discussed with the patient and family. After consideration of risks, benefits and other options for treatment, the patient has consented to  Procedure(s): Left Heart Cath and Coronary Angiography (N/A) and possible PCI  as a surgical intervention .  The patient's history has been reviewed, patient examined, no change in status, stable for surgery.  I have reviewed the patient's chart and labs.  Questions were answered to the patient's satisfaction.   Ischemic Symptoms? Asymptomatic (No ischemic symptoms) Anti-ischemic Medical Therapy? Minimal Therapy (1 class of medications) Non-invasive Test Results? High-risk stress test findings: cardiac mortality >3%/yr Prior CABG? No Previous CABG   Patient Information:   1-2V CAD, no prox LAD  U (6)  Indication: 18; Score: 6   Patient Information:   CTO of 1 vessel, no other CAD  U (4)  Indication: 28; Score: 4   Patient Information:   1V CAD with prox LAD  A (7)  Indication: 34; Score: 7   Patient Information:   2V-CAD with prox LAD  A (7)  Indication: 40; Score: 7   Patient Information:   3V-CAD without LMCA  A (7)  Indication: 46; Score: 7   Patient Information:   3V-CAD without LMCA With Abnormal LV systolic function  A (8)  Indication: 48; Score: 8   Patient Information:   LMCA-CAD  A (9)  Indication: 49; Score: 9   Patient Information:   3V-CAD without LMCA With Low CAD burden(i.e., 3 focal stenoses, low SYNTAX score) PCI  A (7)  Indication: 63; Score: 7   Patient Information:   3V-CAD without LMCA With Low CAD burden(i.e., 3 focal stenoses, low SYNTAX score) CABG  A (9)  Indication: 63; Score: 9   Patient Information:   3V-CAD without LMCA E06c - Intermediate-high CAD burden (i.e., multiple  diffuse lesions, presence of CTO, or high SYNTAX score) PCI  U (4)  Indication: 64; Score: 4   Patient Information:   3V-CAD without LMCA E06c - Intermediate-high CAD burden (i.e., multiple diffuse lesions, presence of CTO, or high SYNTAX score) CABG  A (9)  Indication: 64; Score: 9   Patient Information:   LMCA-CAD With Isolated LMCA stenosis  PCI  U (6)  Indication: 65; Score: 6   Patient Information:   LMCA-CAD With Isolated LMCA stenosis  CABG  A (9)  Indication: 65; Score: 9   Patient Information:   LMCA-CAD Additional CAD, low CAD burden (i.e., 1- to 2-vessel additional involvement, low SYNTAX score) PCI  U (5)  Indication: 66; Score: 5   Patient Information:   LMCA-CAD Additional CAD, low CAD burden (i.e., 1- to 2-vessel additional involvement, low SYNTAX score) CABG  A (9)  Indication: 66; Score: 9   Patient Information:   LMCA-CAD Additional CAD, intermediate-high CAD burden (i.e., 3-vessel involvement, presence of CTO, or high SYNTAX score) PCI  I (3)  Indication: 67; Score: 3   Patient Information:   LMCA-CAD Additional CAD, intermediate-high CAD burden (i.e., 3-vessel involvement, presence of CTO, or high SYNTAX score) CABG  A (9)  Indication: 67; Score: 9   George Lee

## 2016-01-19 NOTE — H&P (Signed)
OFFICE VISIT NOTES COPIED TO EPIC FOR DOCUMENTATION  . History of Present Illness Neldon Labella AGNP-C; 01/14/2016 8:15 AM) Patient words: Last O/V 12/31/2015; F/U for Echo, Nuc results for surgical clearance.  The patient is a 65 year old male who presents for a Follow-up for Preoperative evaluation. He has a history of hypertension and diabetes. He recently saw his PCP for medical clearance prior to undergoing laparoscopic radical prostatectomy with bilateral pelvic lymph node dissection on June 30 by Dr. Louis Meckel. An EKG was performed that revealed frequent PVCs and he was referred here for further evaluation and cardiac clearance. He is essentially asymptomatic. Denies any chest pain, shortness of breath, palpitations, dizziness, syncope, or symptoms suggestive of claudication or TIA. Diabetes is fairly well controlled with recent HbA1c of 6.0%, he has hypertriglyceridemia with normal LDL.  He was scheduled for echo and nuclear stress test and presents today for follow up.   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 Mild concentric hypertrophy of the left ventricle. Normal global wall motion. Doppler evidence of grade I (impaired) diastolic dysfunction. Calculated EF 55%. Trileaflet aortic valve with trace regurgitation. Trace mitral regurgitation. Exercise sestamibi stress test 01/05/2016: 1. The resting electrocardiogram demonstrated normal sinus rhythm, normal resting conduction and frequent PVC. There were frequent PVCs throughout the stress test. Stress EKG was positive for myocardial ischemia with development of 2 mm horizontal ST segment depression or T-wave inversion noted in the lateral leads which immediately normalized into recovery, however reappeared at 2  minutes into recovery and persisted for greater than 3 minutes into recovery. Patient exercised on Bruce protocol for 8:30 minutes and achieved 10.16 METS. Stress test terminated due to target heart rate( 85% MPHR) and fatigue. 2. The perfusion imaging study demonstrates a moderate-sized severe ischemia in the mid to distal and apical anterior, anteroseptal region. Left ventricular systolic function although preserved at 60%, shows anterior wall hypokinesis. The left ventricle also appeared to be slightly dilated in rest images. This is a high risk study, consider further cardiac work-up. Pre-operative cardiovascular examination (Z01.810)  Laparoscopic radical prostatectomy with bilateral pelvic lymph node dissection on June 30 by Dr. 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 Codeine/Codeine Derivatives  Nausea. Lisinopril *ANTIHYPERTENSIVES*  Cough.  Family History (April Louretta Shorten; 01/13/2016 11:21 AM) Mother  Living; Had CABG x 3 at age 30 Father  Deceased. at age 21 from [place]; no Known Heart conditions Sister 2  Younger Brother 2  Younger  Social History (April Louretta Shorten; 01/13/2016 11:21 AM) Current tobacco use  Never smoker. Alcohol Use  Moderate alcohol use. Marital status  Divorced. Number of Children  2. Living Situation  Lives alone.  Past Surgical History (April Louretta Shorten; 01/13/2016 11:21 AM) Gastric Bypass 2009  Medication History (April Garrison; 01/13/2016 11:29 AM) Valsartan-Hydrochlorothiazide (160-12.5MG  Tablet, 1 (one) Tablet Oral daily, Taken starting 12/31/2015) Active. (Pt will start today - he is still taking Lisinopril-HCTZ) MetFORMIN HCl (1000MG  Tablet, 1 Oral two times daily) Active. Vitamin B1 (250mg  Oral daily) Specific dose unknown - Active. Magnesium (200MG  Tablet, 400mg  Oral two times daily) Active. Zinc (50MG  Tablet, 1 Oral two times daily)  Active. Multiple Vitamin (1 (one) Oral two times daily) Active. Vitamin D3 (2000UNIT Tablet, 1 Oral two times daily) Active. Calcium Citrate + D (600mg   2 Oral two times daily) Specific dose unknown - Active. Aspirin EC (81MG  Tablet DR, 1 Oral two times daily) Active. Medications Reconciled (Verbally)  Diagnostic Studies History Neldon Labella, AGNP-C; 01/14/2016 8:04 AM) Worthy Keeler  06/01/2015: Glucose 148, creatinine 0.87, potassium 4.5, CMP otherwise normal, vitamin B12 normal, CBC normal, HbA1c 8.4%, total cholesterol 130, triglycerides 260, HDL 31, LDL 47, PSA elevated at 5.13, vitamin D 53, testosterone panel normal Echocardiogram 01/06/2016 Left ventricle cavity is normal in size. Mild concentric hypertrophy of the left ventricle. Normal global wall motion. Doppler evidence of grade I (impaired) diastolic dysfunction. Calculated EF 55%. Trileaflet aortic valve with trace regurgitation. Trace mitral regurgitation. Nuclear stress test 01/05/2016 1. The resting electrocardiogram demonstrated normal sinus rhythm, normal resting conduction and frequent PVC. There were frequent PVCs throughout the stress test. Stress EKG was positive for myocardial ischemia with development of 2 mm horizontal ST segment depression or T-wave inversion noted in the lateral leads which immediately normalized into recovery, however reappeared at 2 minutes into recovery and persisted for greater than 3 minutes into recovery. Patient exercised on Bruce protocol for 8:30 minutes and achieved 10.16 METS. Stress test terminated due to target heart rate( 85% MPHR) and fatigue. 2. The perfusion imaging study demonstrates a moderate-sized severe ischemia in the mid to distal and apical anterior, anteroseptal region. Left ventricular systolic function although preserved at 60%, shows anterior wall hypokinesis. The left ventricle also appeared to be slightly dilated in rest images. This is a high risk study, consider further  cardiac work-up. Sleep Study 2007 Neg for Sleep Apnea Colonoscopy 2012 several benign polyps MRI 01/12/2016 Prostate    Review of Systems (Bridgette Ebony Hail AGNP-C; 01/13/2016 12:01 PM) General Not Present- Anorexia, Fatigue and Fever. Respiratory Not Present- Cough, Decreased Exercise Tolerance, Difficulty Breathing on Exertion and Dyspnea. Cardiovascular Not Present- Chest Pain, Claudications, Edema, Orthopnea, Palpitations and Paroxysmal Nocturnal Dyspnea. Gastrointestinal Not Present- Black, Tarry Stool, Change in Bowel Habits and Nausea. Neurological Not Present- Focal Neurological Symptoms and Syncope. Endocrine Not Present- Cold Intolerance, Excessive Sweating, Heat Intolerance and Thyroid Problems. Hematology Not Present- Anemia, Easy Bruising, Petechiae and Prolonged Bleeding.  Vitals (April Garrison; 01/13/2016 11:31 AM) 01/13/2016 11:22 AM Weight: 190.56 lb Height: 69in Body Surface Area: 2.02 m Body Mass Index: 28.14 kg/m  Pulse: 86 (Regular)  P.OX: 97% (Room air) BP: 128/72 (Sitting, Left Arm, Standard)       Physical Exam (Bridgette Allison AGNP-C; 01/13/2016 12:02 PM) General Mental Status-Alert. General Appearance-Cooperative and Appears stated age. Build & Nutrition-Moderately built.  Head and Neck Thyroid Gland Characteristics - normal size and consistency and no palpable nodules.  Chest and Lung Exam Chest and lung exam reveals -quiet, even and easy respiratory effort with no use of accessory muscles, non-tender and on auscultation, normal breath sounds, no adventitious sounds.  Cardiovascular Cardiovascular examination reveals -carotid auscultation reveals no bruits, abdominal aorta auscultation reveals no bruits and no prominent pulsation, femoral artery auscultation bilaterally reveals normal pulses, no bruits, no thrills, normal pedal pulses bilaterally and no digital clubbing, cyanosis, edema, increased warmth or  tenderness. Auscultation Rhythm - Frequent Ectopy. Heart Sounds - S1 WNL and S2 WNL.  Abdomen Palpation/Percussion Palpation and Percussion of the abdomen reveal - Non Tender and No hepatosplenomegaly.  Neurologic Neurologic evaluation reveals -alert and oriented x 3 with no impairment of recent or remote memory. Motor-Grossly intact without any focal deficits.  Musculoskeletal Global Assessment Left Lower Extremity - no deformities, masses or tenderness, no known fractures. Right Lower Extremity - no deformities,  masses or tenderness, no known fractures.    Assessment & Plan (Bridgette Ebony Hail AGNP-C; 01/14/2016 8:14 AM) Pre-operative cardiovascular examination (Z01.810) Story: Laparoscopic radical prostatectomy with bilateral pelvic lymph node dissection on June 30 by Dr. Louis Meckel Benign essential HTN (I10) Impression: EKG 12/31/2015: Sinus rhythm at a rate of 75 bpm, normal axis, normal intervals, no evidence of ischemia. Frequent PVCs. Uncontrolled type 2 diabetes mellitus without complication, without long-term current use of insulin (E11.65) Abnormal nuclear stress test (R94.39) Current Plans Started Metoprolol Succinate ER 25MG , 1 (one) Tablet daily, #30, 01/13/2016, Ref. x1. 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 Mild concentric hypertrophy of the left ventricle. Normal global wall motion. Doppler evidence of grade I (impaired) diastolic dysfunction. Calculated EF 55%. Trileaflet aortic valve with trace regurgitation. Trace mitral regurgitation.  Exercise sestamibi stress test 01/05/2016: 1. The resting electrocardiogram demonstrated normal sinus rhythm, normal resting conduction and frequent PVC. There were frequent PVCs throughout the stress test. Stress EKG was positive for myocardial ischemia with development of 2 mm horizontal ST segment depression or T-wave inversion noted in the lateral leads which immediately  normalized into recovery, however reappeared at 2 minutes into recovery and persisted for greater than 3 minutes into recovery. Patient exercised on Bruce protocol for 8:30 minutes and achieved 10.16 METS. Stress test terminated due to target heart rate( 85% MPHR) and fatigue. 2. The perfusion imaging study demonstrates a moderate-sized severe ischemia in the mid to distal and apical anterior, anteroseptal region. Left ventricular systolic function although preserved at 60%, shows anterior wall hypokinesis. The left ventricle also appeared to be slightly dilated in rest images. This is a high risk study, consider further cardiac work-up.  Current Plans Mechanism of underlying disease process and action of medications discussed with the patient. I discussed primary/secondary prevention and also dietary counseling was done. He presents for follow-up of echocardiogram and nuclear stress test performed for preoperative cardiac evaluation due to frequent PVCs noted on exam. Echocardiogram essentially normal with only mild LVH and LVEF of 55%. Stress EKG was positive for ischemia with development of a 2 mm horizontal ST segment depression and T-wave inversion in lateral leads. Perfusion imaging also abnormal revealing a moderate size severe ischemia in the mid to distal and apical anterior and anteroseptal regions with anterior wall hypokinesis, fortunately with preserved EF of 60%. Given abnormal stress test and upcoming surgery, discussed that coronary angiogram is necessary to further risk stratify him prior to him undergoing surgery. He is clearly very frustrated given recent diagnosis of prostate cancer and now abnormal stress test. Explained procedure of cardiac catheterization as well as risks. If Dr. Einar Gip decides that it is safe for him to proceed with surgery without PCI, then we will see him back following his surgery for further recommendations. However, if cardiac catheterization reveals any high-grade  stenoses or unstable plaques that require immediate PCI, then this could potentially delay surgery up to 6 months. He is willing to proceed and is requesting this be performed as soon as possible. Will also add metoprolol for cardioprotection as well as for frequent PVCs. Further recommendations to follow coronary angiogram.   Signed by Neldon Labella, AGNP-C (01/14/2016 8:16 AM)

## 2016-01-19 NOTE — Progress Notes (Addendum)
I discussed the findings of the coronary angiography again with the patient while patient was in the recovery room awake and alert and oriented, his daughter also presents the bedside.  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  Would recommend continuing aspirin peri-procedurally.  Will add atorvastatin 80 mg daily.  Isosorbide mononitrate 60 mg has been added.  Patient now states that he has been having exertional chest pain.  Increase beta blocker, Toprol-XL from 25 mg once a day to twice a day.    I will consider PCI to the LAD and circumflex coronary artery once his surgical issues are resolved.  Patient does not want to have any kind of interventions which will prohibit him from proceeding with radical prostatectomy which is understandable.  Please note: DM uncontrolled with Hyperglycemia added to diagnosis

## 2016-01-19 NOTE — Discharge Instructions (Signed)
Radial Site Care °Refer to this sheet in the next few weeks. These instructions provide you with information about caring for yourself after your procedure. Your health care provider may also give you more specific instructions. Your treatment has been planned according to current medical practices, but problems sometimes occur. Call your health care provider if you have any problems or questions after your procedure. °WHAT TO EXPECT AFTER THE PROCEDURE °After your procedure, it is typical to have the following: °· Bruising at the radial site that usually fades within 1-2 weeks. °· Blood collecting in the tissue (hematoma) that may be painful to the touch. It should usually decrease in size and tenderness within 1-2 weeks. °HOME CARE INSTRUCTIONS °· Take medicines only as directed by your health care provider. °· You may shower 24-48 hours after the procedure or as directed by your health care provider. Remove the bandage (dressing) and gently wash the site with plain soap and water. Pat the area dry with a clean towel. Do not rub the site, because this may cause bleeding. °· Do not take baths, swim, or use a hot tub until your health care provider approves. °· Check your insertion site every day for redness, swelling, or drainage. °· Do not apply powder or lotion to the site. °· Do not flex or bend the affected arm for 24 hours or as directed by your health care provider. °· Do not push or pull heavy objects with the affected arm for 24 hours or as directed by your health care provider. °· Do not lift over 10 lb (4.5 kg) for 5 days after your procedure or as directed by your health care provider. °· Ask your health care provider when it is okay to: °¨ Return to work or school. °¨ Resume usual physical activities or sports. °¨ Resume sexual activity. °· Do not drive home if you are discharged the same day as the procedure. Have someone else drive you. °· You may drive 24 hours after the procedure unless otherwise  instructed by your health care provider. °· Do not operate machinery or power tools for 24 hours after the procedure. °· If your procedure was done as an outpatient procedure, which means that you went home the same day as your procedure, a responsible adult should be with you for the first 24 hours after you arrive home. °· Keep all follow-up visits as directed by your health care provider. This is important. °SEEK MEDICAL CARE IF: °· You have a fever. °· You have chills. °· You have increased bleeding from the radial site. Hold pressure on the site. °SEEK IMMEDIATE MEDICAL CARE IF: °· You have unusual pain at the radial site. °· You have redness, warmth, or swelling at the radial site. °· You have drainage (other than a small amount of blood on the dressing) from the radial site. °· The radial site is bleeding, and the bleeding does not stop after 30 minutes of holding steady pressure on the site. °· Your arm or hand becomes pale, cool, tingly, or numb. °  °This information is not intended to replace advice given to you by your health care provider. Make sure you discuss any questions you have with your health care provider. °  °Document Released: 08/14/2010 Document Revised: 08/02/2014 Document Reviewed: 01/28/2014 °Elsevier Interactive Patient Education ©2016 [employer] ° °

## 2016-01-20 DIAGNOSIS — E1165 Type 2 diabetes mellitus with hyperglycemia: Secondary | ICD-10-CM | POA: Diagnosis present

## 2016-01-23 ENCOUNTER — Encounter (HOSPITAL_COMMUNITY): Admission: RE | Payer: Self-pay | Source: Ambulatory Visit

## 2016-01-23 ENCOUNTER — Inpatient Hospital Stay (HOSPITAL_COMMUNITY): Admission: RE | Admit: 2016-01-23 | Payer: BLUE CROSS/BLUE SHIELD | Source: Ambulatory Visit | Admitting: Urology

## 2016-01-23 SURGERY — PROSTATECTOMY, RADICAL, ROBOT-ASSISTED, LAPAROSCOPIC
Anesthesia: General | Laterality: Bilateral

## 2016-01-24 DEATH — deceased

## 2016-01-28 DIAGNOSIS — I2510 Atherosclerotic heart disease of native coronary artery without angina pectoris: Secondary | ICD-10-CM | POA: Diagnosis present

## 2016-02-02 ENCOUNTER — Encounter (HOSPITAL_COMMUNITY): Admission: RE | Payer: Self-pay | Source: Ambulatory Visit

## 2016-02-02 ENCOUNTER — Ambulatory Visit (HOSPITAL_COMMUNITY): Admission: RE | Admit: 2016-02-02 | Payer: BLUE CROSS/BLUE SHIELD | Source: Ambulatory Visit | Admitting: Cardiology

## 2016-02-02 SURGERY — CORONARY STENT INTERVENTION

## 2016-02-02 SURGERY — Surgical Case
Anesthesia: *Unknown

## 2017-07-22 IMAGING — MR MR PROSTATE WO/W CM
23 of 54 series · 23 of 54 positions shown · IV contrast (multihance)
Comparison: None.

CLINICAL DATA: Prostate carcinoma. Gleason score equal 3+ 4 equals
7 in the LEFT gland

EXAM:
MR PROSTATE WITHOUT AND WITH CONTRAST
TECHNIQUE: Multiplanar multisequence MRI images were obtained of the pelvis
centered about the prostate. Pre and post contrast images were
obtained.
CONTRAST:  18mL MULTIHANCE GADOBENATE DIMEGLUMINE 529 MG/ML IV SOLN

[Series 3: bSSFP fat-sat · axial · 6.0mm · 0.86mm/px · 1 of 44 slices shown]
[im 1/44]
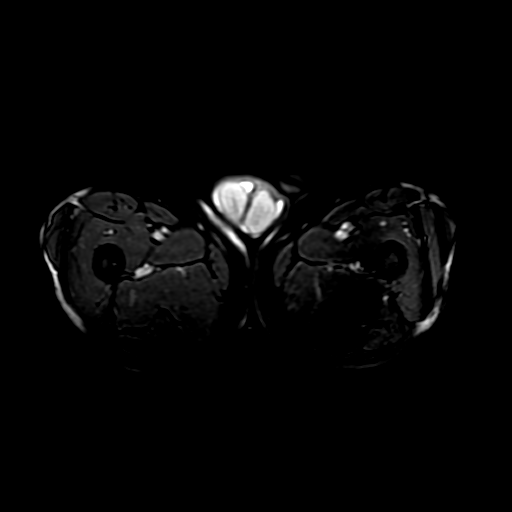

[Series 4: T1 · axial · 6.0mm · 0.86mm/px · 1 of 44 slices shown (1 of 2)]
[im 1/44]
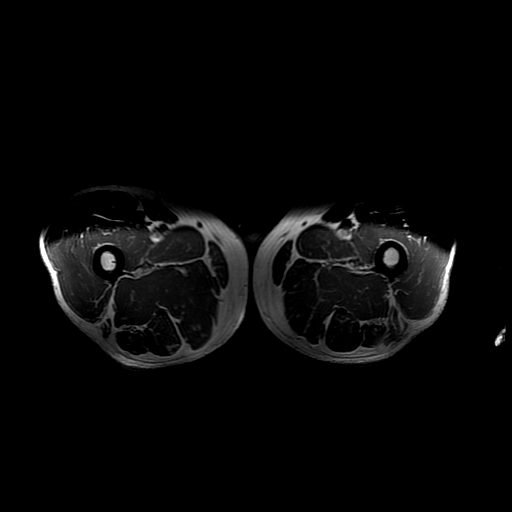

[Series 5: T2 · axial · 3.0mm · 0.29mm/px · 1 of 30 slices shown (1 of 4)]
[im 1/30]
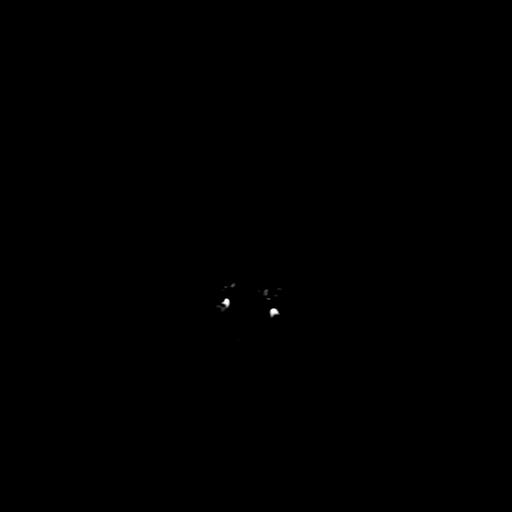

[Series 6: T1 · axial · 3.0mm · 0.29mm/px · 1 of 30 slices shown (2 of 2)]
[im 1/30]
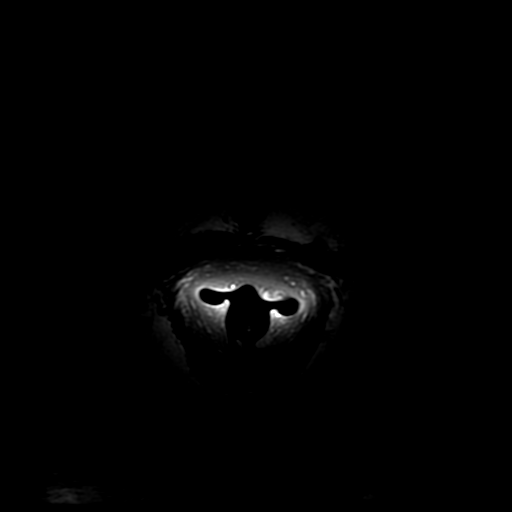

[Series 7: T2 · sagittal · 4.0mm · 0.29mm/px · 1 of 20 slices shown (2 of 4)]
[im 1/20]
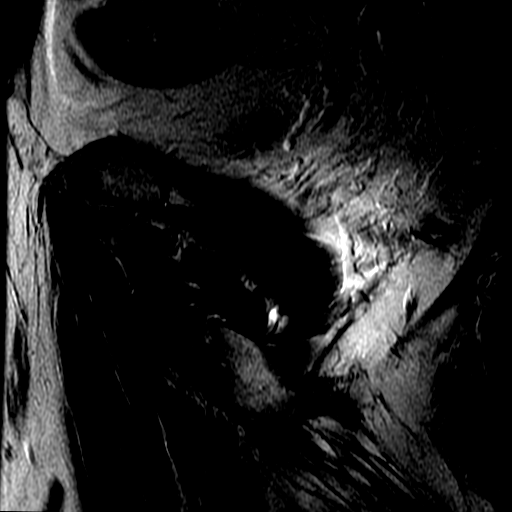

[Series 8: T2 · coronal · 4.0mm · 0.29mm/px · 1 of 20 slices shown (3 of 4)]
[im 1/20]
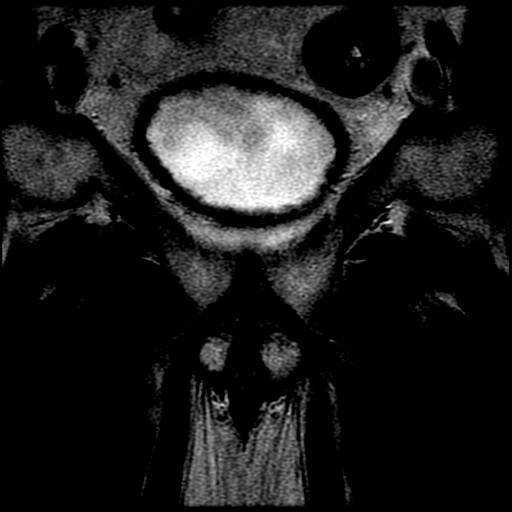

[Series 9: DWI · axial · 3.0mm · 0.59mm/px · 1 of 51 slices shown (1 of 6)]
[im 1/51]
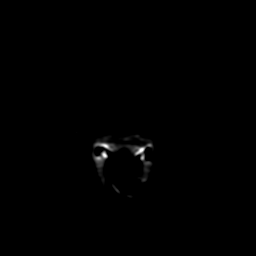

[Series 10: T2 · coronal · 4.0mm · 0.29mm/px · 1 of 20 slices shown (4 of 4)]
[im 1/20]
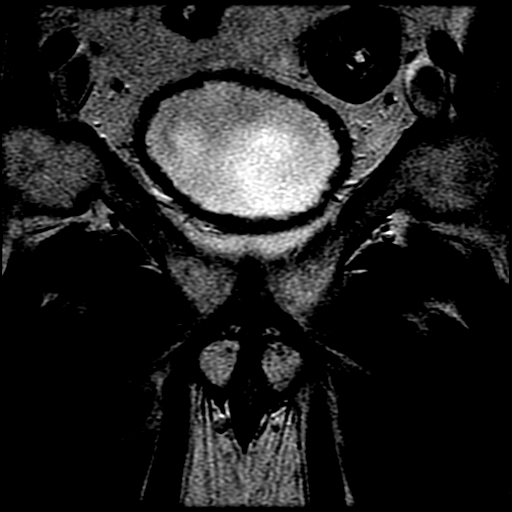

[Series 11: DWI · axial · 3.0mm · 0.59mm/px · 1 of 47 slices shown (2 of 6)]
[im 1/47]
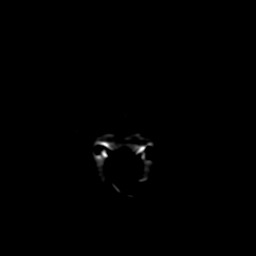

[Series 12: DWI · axial · 3.0mm · 0.59mm/px · 1 of 47 slices shown (3 of 6)]
[im 1/47]
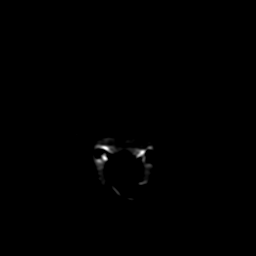

[Series 900: DWI · axial · 3.0mm · 0.59mm/px · 1 of 26 slices shown (4 of 6)]
[im 1/26]
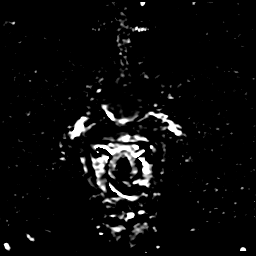

[Series 1100: DWI · axial · 3.0mm · 0.59mm/px · 1 of 26 slices shown (5 of 6)]
[im 1/26]
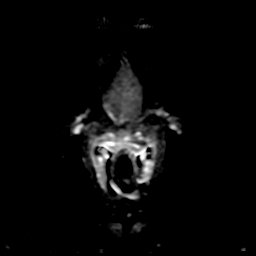

[Series 1200: DWI · axial · 3.0mm · 0.59mm/px · 1 of 26 slices shown (6 of 6)]
[im 1/26]
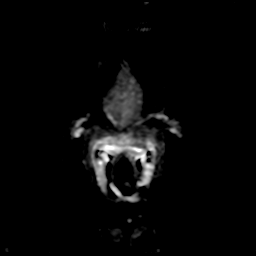

[((id)/(id)/1)-((id)/(id)/1) · axial · 3.0mm · 0.43mm/px · 1 of 65 slices shown (1 of 10)]
[im 1/65]
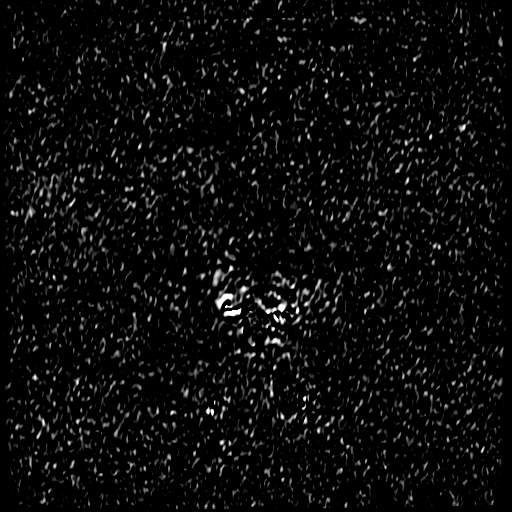

[((id)/(id)/1)-((id)/(id)/1) · axial · 3.0mm · 0.43mm/px · 1 of 67 slices shown (2 of 10)]
[im 1/67]
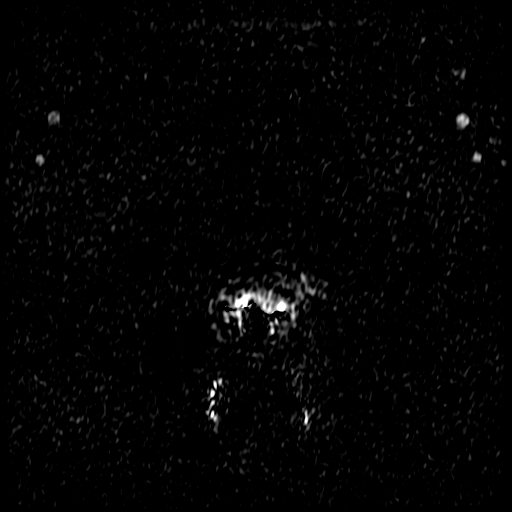

[((id)/(id)/1)-((id)/(id)/1) · axial · 3.0mm · 0.43mm/px · 1 of 66 slices shown (3 of 10)]
[im 1/66]
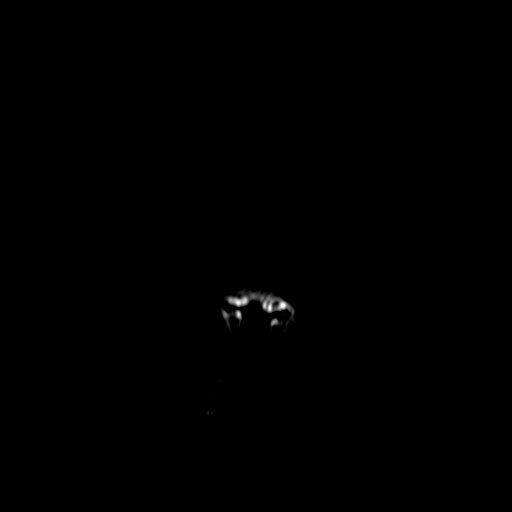

[((id)/(id)/1)-((id)/(id)/1) · axial · 3.0mm · 0.43mm/px · 1 of 68 slices shown (4 of 10)]
[im 1/68]
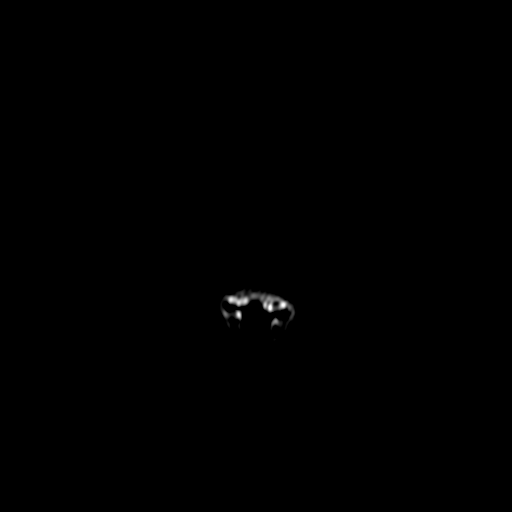

[((id)/(id)/1)-((id)/(id)/1) · axial · 3.0mm · 0.43mm/px · 1 of 67 slices shown (5 of 10)]
[im 1/67]
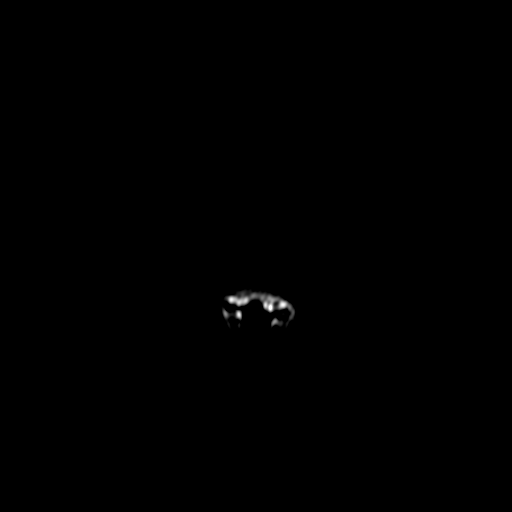

[((id)/(id)/1)-((id)/(id)/1) · axial · 3.0mm · 0.43mm/px · 1 of 68 slices shown (6 of 10)]
[im 1/68]
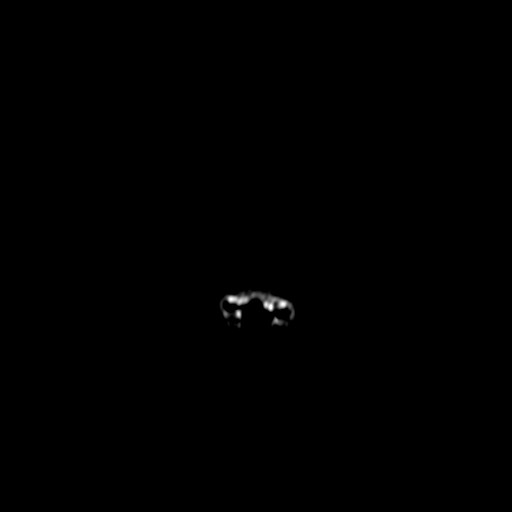

[((id)/(id)/1)-((id)/(id)/1) · axial · 3.0mm · 0.43mm/px · 1 of 68 slices shown (7 of 10)]
[im 1/68]
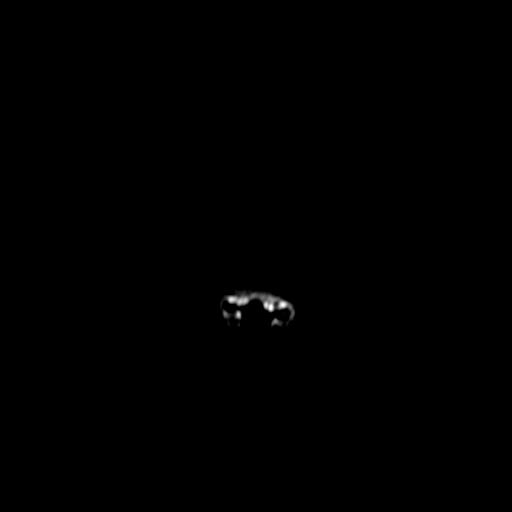

[((id)/(id)/1)-((id)/(id)/1) · axial · 3.0mm · 0.43mm/px · 1 of 67 slices shown (8 of 10)]
[im 1/67]
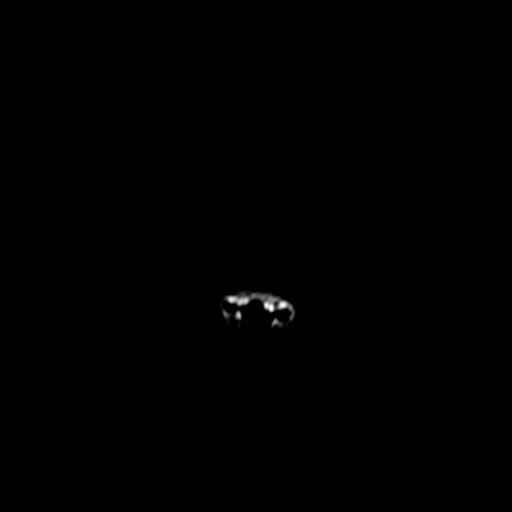

[((id)/(id)/1)-((id)/(id)/1) · axial · 3.0mm · 0.43mm/px · 1 of 68 slices shown (9 of 10)]
[im 1/68]
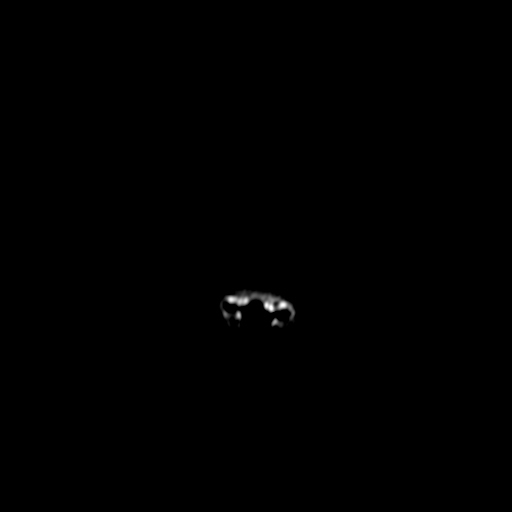

[((id)/(id)/1)-((id)/(id)/1) · axial · 3.0mm · 0.43mm/px · 1 of 68 slices shown (10 of 10)]
[im 1/68]
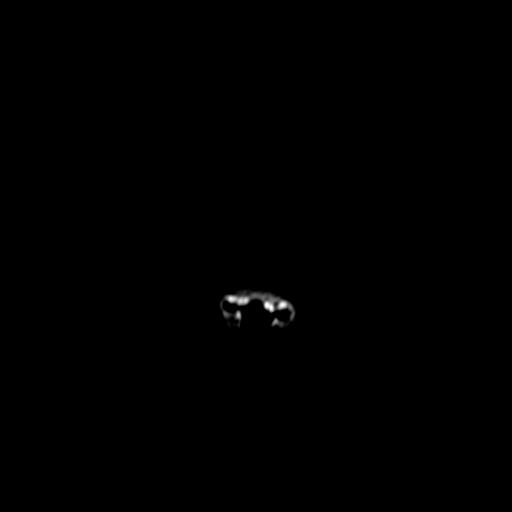

[23 of 54 positions shown; findings below may reference images not displayed]

FINDINGS: Prostate: Within the LEFT lateral mid gland there is a 7 mm x 7 mm
focus of restricted diffusion (image 11, series 6722). This lesion
corresponds to a hypo intense lesion in the peripheral zone on T2
weighted imaging (series 5). Lesion measures 6 mm in craniocaudad
dimension (image 12, series 10).

The prostatic capsule adjacent to the lesion appears intact (image
12, series 10).

No additional lesions are identified in the peripheral zone. On the
T1 weighted imaging there is hemorrhage within the peripheral zone
primarily on the LEFT.

The transitional zone is mildly nodular.

The neurovascular bundles are intact. The rectoprostatic angles are
sharp.

Seminal vesicles are normal.

Gland volume equals 43 by 23 x 39 mm.

Transcapsular spread:  Absent

Seminal vesicle involvement: Absent

Neurovascular bundle involvement: Absent

Pelvic adenopathy: No adenopathy. Small 4 mm short axis LEFT
external iliac lymph node (image 13, series 4 )

Bone metastasis: Absent

Other findings: Prostatic volume equals 43 x 23 x 39 mm per
IMPRESSION: 1. Single focus of high-grade carcinoma in the LEFT lateral mid
gland without evidence of extracapsular extension.
2. No transscapular spread or neurovascular bundle involvement.
3. Normal seminal vesicles
4. Single non pathologically enlarged LEFT external iliac lymph
node.
